# Patient Record
Sex: Female | Born: 2000 | Race: Black or African American | Hispanic: No | Marital: Single | Smoking: Current every day smoker
Health system: Southern US, Community
[De-identification: ages and names within clinical notes are randomized; demographics above are authoritative.]

## PROBLEM LIST (undated history)

## (undated) DIAGNOSIS — R45851 Suicidal ideations: Secondary | ICD-10-CM

## (undated) DIAGNOSIS — F322 Major depressive disorder, single episode, severe without psychotic features: Secondary | ICD-10-CM

## (undated) HISTORY — PX: NO PAST SURGERIES: SHX2092

---
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
## 2019-04-04 ENCOUNTER — Other Ambulatory Visit: Payer: Self-pay

## 2019-04-04 ENCOUNTER — Emergency Department (HOSPITAL_BASED_OUTPATIENT_CLINIC_OR_DEPARTMENT_OTHER)
Admission: EM | Admit: 2019-04-04 | Discharge: 2019-04-04 | Discharge status: Against Medical Advice | Payer: Medicaid Other | Attending: Emergency Medicine | Admitting: Emergency Medicine

## 2019-04-04 ENCOUNTER — Encounter (HOSPITAL_BASED_OUTPATIENT_CLINIC_OR_DEPARTMENT_OTHER): Payer: Self-pay | Admitting: Emergency Medicine

## 2019-04-04 DIAGNOSIS — Z87891 Personal history of nicotine dependence: Secondary | ICD-10-CM | POA: Insufficient documentation

## 2019-04-04 DIAGNOSIS — R112 Nausea with vomiting, unspecified: Secondary | ICD-10-CM | POA: Diagnosis not present

## 2019-04-04 DIAGNOSIS — R82998 Other abnormal findings in urine: Secondary | ICD-10-CM | POA: Diagnosis not present

## 2019-04-04 LAB — URINALYSIS, ROUTINE W REFLEX MICROSCOPIC
Glucose, UA: NEGATIVE mg/dL
Hgb urine dipstick: NEGATIVE
Ketones, ur: 40 mg/dL — AB
Nitrite: NEGATIVE
Protein, ur: NEGATIVE mg/dL
Specific Gravity, Urine: >1.03 — ABNORMAL HIGH (ref 1.005–1.030)
pH: 6 (ref 5.0–8.0)

## 2019-04-04 LAB — URINALYSIS, MICROSCOPIC (REFLEX)

## 2019-04-04 LAB — PREGNANCY, URINE: Preg Test, Ur: NEGATIVE

## 2019-04-04 NOTE — ED Provider Notes (Signed)
[facility] EMERGENCY DEPARTMENT Provider Note   CSN: [id number] Arrival date & time: 04/04/19  1604    History   Chief Complaint Chief Complaint  Patient presents with  . Emesis    HPI Jennifer Doyle is a 18 y.o. female past medical history of depression who presents for evaluation of intermittent nausea/vomiting x 1 week.  She reports that when she gets up in the morning, she will have one episode of vomiting and states that the emesis is nonbloody, nonbilious.  Patient reports that throughout the day, she was able to eat.  She does report she feels some nausea with eating but has not had any other episodes of vomiting.  Patient states she has not had any abdominal pain.  Patient states she came in today because she was concerned she might be pregnant.  She has not taken any pregnancy test at home.  Patient has not had any fevers, chest pain, difficulty breathing, dysuria, hematuria, vaginal bleeding, vaginal discharge.     The history is provided by the patient.    Past Medical History:  Diagnosis Date  . MDD (major depressive disorder), single episode, severe , no psychosis (HCC) 07/27/2017  . Suicidal ideation 07/27/2017    Patient Active Problem List   Diagnosis Date Noted  . MDD (major depressive disorder), single episode, severe , no psychosis (HCC) 07/27/2017  . Suicidal ideation 07/27/2017  . 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 NO PAST SURGERIES       OB History    Gravida  1   Para  1   Term  0   Preterm  1   AB      Living  1     SAB      TAB      Ectopic      Multiple      Live Births               Home Medications    Prior to Admission medications   Not on File    Family History No family history on file.  Social History Social History   Tobacco Use  . Smoking status: Former Smoker    Types: Cigarettes  .  Smokeless tobacco: Never Used  . Tobacco comment: 1 "Black" a day  Substance Use Topics  . Alcohol use: No  . Drug use: No     Allergies   Peanut-containing drug products   Review of Systems Review of Systems  Constitutional: Negative for fever.  Respiratory: Negative for cough and shortness of breath.   Cardiovascular: Negative for chest pain.  Gastrointestinal: Positive for nausea and vomiting. Negative for abdominal pain.  Genitourinary: Negative for dysuria, hematuria, vaginal bleeding and vaginal discharge.  Neurological: Negative for headaches.  All other systems reviewed and are negative.    Physical Exam Updated Vital Signs BP 127/75 (BP Location: Right Arm)   Pulse 87   Temp 99 F (37.2 C) (Oral)   Resp 14   Ht  (1.6 m)   Wt 66.2 kg   LMP 03/17/2019   SpO2 100%   BMI 25.86 kg/m   Physical Exam Vitals signs and nursing note reviewed.  Constitutional:      Appearance: Normal appearance. She is well-developed.     Comments: Sitting comfortably on examination table  HENT:     Head: Normocephalic and atraumatic.  Eyes:  General: Lids are normal.     Conjunctiva/sclera: Conjunctivae normal.     Pupils: Pupils are equal, round, and reactive to light.  Neck:     Musculoskeletal: Full passive range of motion without pain.  Cardiovascular:     Rate and Rhythm: Normal rate and regular rhythm.     Pulses: Normal pulses.     Heart sounds: Normal heart sounds. No murmur. No friction rub. No gallop.   Pulmonary:     Effort: Pulmonary effort is normal.     Breath sounds: Normal breath sounds.     Comments: Lungs clear to auscultation bilaterally.  Symmetric chest rise.  No wheezing, rales, rhonchi. Abdominal:     Palpations: Abdomen is soft. Abdomen is not rigid.     Tenderness: There is no abdominal tenderness. There is no right CVA tenderness, left CVA tenderness or guarding.     Comments: Abdomen is soft, non-distended, non-tender. No rigidity, No  guarding. No peritoneal signs.  Musculoskeletal: Normal range of motion.  Skin:    General: Skin is warm and dry.     Capillary Refill: Capillary refill takes less than 2 seconds.  Neurological:     Mental Status: She is alert and oriented to person, place, and time.  Psychiatric:        Speech: Speech normal.      ED Treatments / Results  Labs (all labs ordered are listed, but only abnormal results are displayed) Labs Reviewed  URINALYSIS, ROUTINE W REFLEX MICROSCOPIC - Abnormal; Notable for the following components:      Result Value   Specific Gravity, Urine >1.030 (*)    Bilirubin Urine SMALL (*)    Ketones, ur 40 (*)    Leukocytes,Ua MODERATE (*)    All other components within normal limits  URINALYSIS, MICROSCOPIC (REFLEX) - Abnormal; Notable for the following components:   Bacteria, UA FEW (*)    All other components within normal limits  URINE CULTURE  PREGNANCY, URINE    EKG None  Radiology No results found.  Procedures Procedures (including critical care time)  Medications Ordered in ED Medications - No data to display   Initial Impression / Assessment and Plan / ED Course  I have reviewed the triage vital signs and the nursing notes.  Pertinent labs & imaging results that were available during my care of the patient were reviewed by me and considered in my medical decision making (see chart for details).        18 y.o. F who presents for evaluation of intermittent nausea/vomiting this been ongoing x1 week.  No abdominal pain.  Patient reports he has not any vomiting today.  She has been able to eat today.  No fevers, vaginal, urinary complaints. Patient is afebrile, non-toxic appearing, sitting comfortably on examination table. Vital signs reviewed and stable.  Benign abdominal exam.  Urine ordered at triage.  Urine pregnancy negative.  UA shows moderate leukocytes, pyuria, bacteria.  She does have significant swelling epithelium so question  contaminant.  We will plan for urine culture.  We will plan to p.o. challenge and reevaluate.  RN inform me that patient had eloped prior to completion of treatment.  Portions of this note were generated with Scientist, clinical (histocompatibility and immunogenetics)Dragon dictation software. Dictation errors may occur despite best attempts at proofreading.   Final Clinical Impressions(s) / ED Diagnoses   Final diagnoses:  Nausea and vomiting, intractability of vomiting not specified, unspecified vomiting type    ED Discharge Orders    None  Maxwell Caul, PA-C 04/04/19 2228    Lawrance, Ambrose Finland, MD 04/04/19 2342

## 2019-04-04 NOTE — ED Notes (Signed)
Water given for fluid challenge  ?

## 2019-04-04 NOTE — ED Notes (Signed)
PT not in ED department,or waiting area. After looking 3 times. Provider aware.

## 2019-04-04 NOTE — ED Triage Notes (Signed)
Vomiting in the morning for a week.  Wants to know if she is pregnant.  Has not done any home pregnancy tests.

## 2019-04-04 NOTE — ED Notes (Signed)
Pt not in room or BR. Or waiting area.

## 2019-04-06 LAB — URINE CULTURE: Culture: >=100000 — AB

## 2019-04-22 ENCOUNTER — Other Ambulatory Visit: Payer: Self-pay

## 2019-04-22 ENCOUNTER — Emergency Department (HOSPITAL_BASED_OUTPATIENT_CLINIC_OR_DEPARTMENT_OTHER)
Admission: EM | Admit: 2019-04-22 | Discharge: 2019-04-22 | Disposition: A | Discharge status: Home / Self Care | Payer: Medicaid Other | Attending: Emergency Medicine | Admitting: Emergency Medicine

## 2019-04-22 ENCOUNTER — Encounter (HOSPITAL_BASED_OUTPATIENT_CLINIC_OR_DEPARTMENT_OTHER): Payer: Self-pay | Admitting: Emergency Medicine

## 2019-04-22 DIAGNOSIS — Z87891 Personal history of nicotine dependence: Secondary | ICD-10-CM | POA: Diagnosis not present

## 2019-04-22 DIAGNOSIS — R42 Dizziness and giddiness: Secondary | ICD-10-CM | POA: Diagnosis not present

## 2019-04-22 DIAGNOSIS — Z3202 Encounter for pregnancy test, result negative: Secondary | ICD-10-CM | POA: Diagnosis not present

## 2019-04-22 DIAGNOSIS — R112 Nausea with vomiting, unspecified: Secondary | ICD-10-CM | POA: Insufficient documentation

## 2019-04-22 LAB — URINALYSIS, ROUTINE W REFLEX MICROSCOPIC
Glucose, UA: NEGATIVE mg/dL
Hgb urine dipstick: NEGATIVE
Ketones, ur: NEGATIVE mg/dL
Nitrite: NEGATIVE
Protein, ur: 30 mg/dL — AB
Specific Gravity, Urine: 1.02 (ref 1.005–1.030)
pH: 7 (ref 5.0–8.0)

## 2019-04-22 LAB — URINALYSIS, MICROSCOPIC (REFLEX)

## 2019-04-22 LAB — PREGNANCY, URINE: Preg Test, Ur: NEGATIVE

## 2019-04-22 NOTE — ED Notes (Signed)
Patient is in a conference call while I went to see her.  She asked if I can come back.  Request granted.

## 2019-04-22 NOTE — ED Triage Notes (Signed)
N/V yesterday. None today. States dizziness.

## 2019-04-22 NOTE — Discharge Instructions (Signed)
You were seen in the ED for lightheadedness with walking.  Pregnancy test is negative.  We discussed that symptoms are likely related to dehydration since you do not drink a lot of water.  You should be drinking 1.5 to 2 L of water every day, more if you are sweating or exercising or doing heavy work.  You declined blood work and IV fluids today.  Make sure you stay hydrated at home.  Return to the ED for persistent or worsening lightheadedness, passing out, vomiting, diarrhea, abdominal pain

## 2019-04-22 NOTE — ED Provider Notes (Signed)
[facility] EMERGENCY DEPARTMENT Provider Note   CSN: [id number] Arrival date & time: 04/22/19  1509    History   Chief Complaint Chief Complaint  Patient presents with  . Nausea    HPI Jennifer Doyle is a 18 y.o. female is here for evaluation of lightheadedness.  Described as feeling faint.  Onset yesterday.  Lightheadedness comes on when she tries to get up and stand.  Gets better if she waits a few seconds or sits down.  Reports being nauseous and vomiting once yesterday but none today.  States she could not go to work because when she got there she could not get out of the car because she was too lightheaded.  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  She admits she usually does not drink a lot of water, she drinks 1 cup of water today.  Usually only drinks 1 bottle of water a day.  Since arriving to the ED she feels a lot better and feels like the air conditioning is making her feel better.  No associated CP, SOB, palpitations, syncope.  Denies fevers, chills, cough, congestion, rhinorrhea, sore throat, abdominal pain, diarrhea, constipation, dysuria, hematuria.  No more nausea or vomiting since yesterday.  No sick contacts.    HPI  Past Medical History:  Diagnosis Date  . MDD (major depressive disorder), single episode, severe , no psychosis (HCC) 07/27/2017  . Suicidal ideation 07/27/2017    Patient Active Problem List   Diagnosis Date Noted  . MDD (major depressive disorder), single episode, severe , no psychosis (HCC) 07/27/2017  . Suicidal ideation 07/27/2017  . 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 NO PAST SURGERIES       OB History    Gravida  1   Para  1   Term  0   Preterm  1   AB      Living  1     SAB      TAB      Ectopic      Multiple      Live Births               Home  Medications    Prior to Admission medications   Not on File    Family History No family history on file.  Social History Social History   Tobacco Use  . Smoking status: Former Smoker    Types: Cigarettes  . Smokeless tobacco: Never Used  . Tobacco comment: 1 "Black" a day  Substance Use Topics  . Alcohol use: No  . Drug use: No     Allergies   Peanut-containing drug products   Review of Systems Review of Systems  Neurological: Positive for light-headedness.  All other systems reviewed and are negative.    Physical Exam Updated Vital Signs BP 113/67 (BP Location: Left Arm)   Pulse 79   Temp 98 F (36.7 C) (Oral)   Resp 16   Ht 5\' 3"  (1.6 m)   Wt 68 kg   SpO2 100%   BMI 26.57 kg/m   Physical Exam Vitals signs and nursing note reviewed.  Constitutional:      Appearance: She is well-developed.     Comments: Non toxic in NAD  HENT:     Head: Normocephalic and atraumatic.     Nose: Nose normal.  Mouth/Throat:     Comments: MMM Eyes:     Conjunctiva/sclera: Conjunctivae normal.  Neck:     Musculoskeletal: Normal range of motion.  Cardiovascular:     Rate and Rhythm: Normal rate and regular rhythm.  Pulmonary:     Effort: Pulmonary effort is normal.     Breath sounds: Normal breath sounds.  Abdominal:     General: Bowel sounds are normal.     Palpations: Abdomen is soft.     Tenderness: There is no abdominal tenderness.     Comments: No G/R/R. No suprapubic or CVA tenderness. Negative Murphy's and McBurney's. Active BS to lower quadrants.   Musculoskeletal: Normal range of motion.  Skin:    General: Skin is warm and dry.     Capillary Refill: Capillary refill takes less than 2 seconds.  Neurological:     Mental Status: She is alert.  Psychiatric:        Behavior: Behavior normal.      ED Treatments / Results  Labs (all labs ordered are listed, but only abnormal results are displayed) Labs Reviewed  URINALYSIS, ROUTINE W REFLEX  MICROSCOPIC - Abnormal; Notable for the following components:      Result Value   APPearance HAZY (*)    Bilirubin Urine SMALL (*)    Protein, ur 30 (*)    Leukocytes,Ua TRACE (*)    All other components within normal limits  URINALYSIS, MICROSCOPIC (REFLEX) - Abnormal; Notable for the following components:   Bacteria, UA RARE (*)    All other components within normal limits  PREGNANCY, URINE    EKG None  Radiology No results found.  Procedures Procedures (including critical care time)  Medications Ordered in ED Medications - No data to display   Initial Impression / Assessment and Plan / ED Course  I have reviewed the triage vital signs and the nursing notes.  Pertinent labs & imaging results that were available during my care of the patient were reviewed by me and considered in my medical decision making (see chart for details).       ddx includes orthostasis vs vasovagal light-headedness vs dehydration.  Urine pregnancy negative.  She has no red flags associated with light-headedness such as CP, palpitations, SOB, syncope.  UA obtained at triage shows 30 protein but no ketones.  I offered patient screening labs to evaluate for electrolyte abnormalities, anemia, IVF, EKG, reassessment but she declined and states she thinks she is just dehydrated. She requested discharge.  This is reasonable, given her age, and otherwise benign ROS. Discussed plan to dc with oral hydration, rest. Return precautions given, she verbalized understanding and is in agreement with this.   Final Clinical Impressions(s) / ED Diagnoses   Final diagnoses:  Light headedness    ED Discharge Orders    None       Liberty HandyGibbons, Vaness Jelinski J, PA-C 04/22/19 Morton [id number]    Nanavati, Ankit, MD 04/22/19 1744

## 2019-05-18 ENCOUNTER — Emergency Department (HOSPITAL_BASED_OUTPATIENT_CLINIC_OR_DEPARTMENT_OTHER)
Admission: EM | Admit: 2019-05-18 | Discharge: 2019-05-18 | Disposition: A | Discharge status: Home / Self Care | Payer: Medicaid Other | Source: Home / Self Care | Attending: Emergency Medicine | Admitting: Emergency Medicine

## 2019-05-18 ENCOUNTER — Emergency Department (HOSPITAL_BASED_OUTPATIENT_CLINIC_OR_DEPARTMENT_OTHER): Payer: Medicaid Other

## 2019-05-18 ENCOUNTER — Encounter (HOSPITAL_BASED_OUTPATIENT_CLINIC_OR_DEPARTMENT_OTHER): Payer: Self-pay | Admitting: *Deleted

## 2019-05-18 ENCOUNTER — Other Ambulatory Visit: Payer: Self-pay

## 2019-05-18 DIAGNOSIS — F17210 Nicotine dependence, cigarettes, uncomplicated: Secondary | ICD-10-CM | POA: Insufficient documentation

## 2019-05-18 DIAGNOSIS — Z91010 Allergy to peanuts: Secondary | ICD-10-CM | POA: Insufficient documentation

## 2019-05-18 DIAGNOSIS — N7093 Salpingitis and oophoritis, unspecified: Secondary | ICD-10-CM

## 2019-05-18 DIAGNOSIS — Z20828 Contact with and (suspected) exposure to other viral communicable diseases: Secondary | ICD-10-CM | POA: Insufficient documentation

## 2019-05-18 LAB — COMPREHENSIVE METABOLIC PANEL
ALT: 8 U/L (ref 0–44)
AST: 11 U/L — ABNORMAL LOW (ref 15–41)
Albumin: 3.7 g/dL (ref 3.5–5.0)
Alkaline Phosphatase: 57 U/L (ref 38–126)
Anion gap: 12 (ref 5–15)
BUN: 12 mg/dL (ref 6–20)
CO2: 20 mmol/L — ABNORMAL LOW (ref 22–32)
Calcium: 9 mg/dL (ref 8.9–10.3)
Chloride: 104 mmol/L (ref 98–111)
Creatinine, Ser: 0.7 mg/dL (ref 0.44–1.00)
GFR calc Af Amer: >60 mL/min (ref >60)
GFR calc non Af Amer: >60 mL/min (ref >60)
Glucose, Bld: 82 mg/dL (ref 70–99)
Potassium: 3.1 mmol/L — ABNORMAL LOW (ref 3.5–5.1)
Sodium: 136 mmol/L (ref 135–145)
Total Bilirubin: 0.5 mg/dL (ref 0.3–1.2)
Total Protein: 8.2 g/dL — ABNORMAL HIGH (ref 6.5–8.1)

## 2019-05-18 LAB — URINALYSIS, ROUTINE W REFLEX MICROSCOPIC
Glucose, UA: NEGATIVE mg/dL
Hgb urine dipstick: NEGATIVE
Ketones, ur: NEGATIVE mg/dL
Leukocytes,Ua: NEGATIVE
Nitrite: POSITIVE — AB
Protein, ur: 100 mg/dL — AB
Specific Gravity, Urine: >1.03 — ABNORMAL HIGH (ref 1.005–1.030)
pH: 6 (ref 5.0–8.0)

## 2019-05-18 LAB — URINALYSIS, MICROSCOPIC (REFLEX): RBC / HPF: NONE SEEN RBC/hpf (ref 0–5)

## 2019-05-18 LAB — CBC WITH DIFFERENTIAL/PLATELET
Abs Immature Granulocytes: 0.08 10*3/uL — ABNORMAL HIGH (ref 0.00–0.07)
Basophils Absolute: 0.1 10*3/uL (ref 0.0–0.1)
Basophils Relative: 0 %
Eosinophils Absolute: 0 10*3/uL (ref 0.0–0.5)
Eosinophils Relative: 0 %
HCT: 30.4 % — ABNORMAL LOW (ref 36.0–46.0)
Hemoglobin: 9.8 g/dL — ABNORMAL LOW (ref 12.0–15.0)
Immature Granulocytes: 0 %
Lymphocytes Relative: 8 %
Lymphs Abs: 1.5 10*3/uL (ref 0.7–4.0)
MCH: 28.3 pg (ref 26.0–34.0)
MCHC: 32.2 g/dL (ref 30.0–36.0)
MCV: 87.9 fL (ref 80.0–100.0)
Monocytes Absolute: 0.7 10*3/uL (ref 0.1–1.0)
Monocytes Relative: 3 %
Neutro Abs: 17.4 10*3/uL — ABNORMAL HIGH (ref 1.7–7.7)
Neutrophils Relative %: 89 %
Platelets: 336 10*3/uL (ref 150–400)
RBC: 3.46 MIL/uL — ABNORMAL LOW (ref 3.87–5.11)
RDW: 18.7 % — ABNORMAL HIGH (ref 11.5–15.5)
WBC: 19.7 10*3/uL — ABNORMAL HIGH (ref 4.0–10.5)
nRBC: 0 % (ref 0.0–0.2)

## 2019-05-18 LAB — WET PREP, GENITAL
Sperm: NONE SEEN
Yeast Wet Prep HPF POC: NONE SEEN

## 2019-05-18 LAB — PREGNANCY, URINE: Preg Test, Ur: NEGATIVE

## 2019-05-18 LAB — SARS CORONAVIRUS 2 AG (30 MIN TAT): SARS Coronavirus 2 Ag: NEGATIVE

## 2019-05-18 MED ORDER — IOHEXOL 300 MG/ML  SOLN
100.0000 mL | Freq: Once | INTRAMUSCULAR | Status: AC | PRN
  Administered 2019-05-18: 100 mL via INTRAVENOUS

## 2019-05-18 MED ORDER — MORPHINE SULFATE (PF) 4 MG/ML IV SOLN
4.0000 mg | Freq: Once | INTRAVENOUS | Status: AC
  Administered 2019-05-18: 4 mg via INTRAVENOUS
  Filled 2019-05-18: qty 1

## 2019-05-18 MED ORDER — METRONIDAZOLE IN NACL 5-0.79 MG/ML-% IV SOLN
500.0000 mg | Freq: Once | INTRAVENOUS | Status: AC
  Administered 2019-05-18: 500 mg via INTRAVENOUS
  Filled 2019-05-18: qty 100

## 2019-05-18 MED ORDER — MORPHINE SULFATE (PF) 2 MG/ML IV SOLN
2.0000 mg | Freq: Once | INTRAVENOUS | Status: AC
  Administered 2019-05-18: 2 mg via INTRAVENOUS
  Filled 2019-05-18: qty 1

## 2019-05-18 MED ORDER — SODIUM CHLORIDE 0.9 % IV SOLN
100.0000 mg | Freq: Once | INTRAVENOUS | Status: AC
  Administered 2019-05-18: 100 mg via INTRAVENOUS
  Filled 2019-05-18: qty 100

## 2019-05-18 MED ORDER — DOXYCYCLINE HYCLATE 100 MG IV SOLR
INTRAVENOUS | Status: AC
  Filled 2019-05-18: qty 100

## 2019-05-18 MED ORDER — ACETAMINOPHEN 500 MG PO TABS
1000.0000 mg | ORAL_TABLET | Freq: Once | ORAL | Status: AC
  Administered 2019-05-18: 1000 mg via ORAL
  Filled 2019-05-18: qty 2

## 2019-05-18 MED ORDER — SODIUM CHLORIDE 0.9 % IV BOLUS
1000.0000 mL | Freq: Once | INTRAVENOUS | Status: AC
  Administered 2019-05-18: 1000 mL via INTRAVENOUS

## 2019-05-18 MED ORDER — ONDANSETRON HCL 4 MG/2ML IJ SOLN
4.0000 mg | Freq: Once | INTRAMUSCULAR | Status: AC
  Administered 2019-05-18: 4 mg via INTRAVENOUS
  Filled 2019-05-18: qty 2

## 2019-05-18 NOTE — ED Notes (Signed)
EDP at bedside assessing patient during triage

## 2019-05-18 NOTE — ED Notes (Signed)
Patient and family member updated on care plan.

## 2019-05-18 NOTE — ED Provider Notes (Signed)
[facility] EMERGENCY DEPARTMENT Provider Note   CSN: [id number] Arrival date & time: 05/18/19  1548     History   Chief Complaint Chief Complaint  Patient presents with   Abdominal Pain    HPI Jennifer Doyle is a 18 y.o. female.     The history is provided by the patient.  Abdominal Pain Pain location:  LLQ and RLQ Pain quality: aching, cramping, sharp and shooting   Pain radiates to:  Does not radiate Pain severity:  Severe Onset quality:  Gradual Duration:  18 hours Timing:  Constant Progression:  Worsening Chronicity:  New Context: not diet changes, not medication withdrawal, not previous surgeries, not recent illness, not sick contacts and not suspicious food intake   Context comment:  Patient states that yesterday sometime after dinner and before she went to bed her lower abdomen started hurting and worsened throughout the night to where she was unable to sleep Relieved by: Better with lying still. Worsened by:  Coughing, movement, deep breathing and position changes Ineffective treatments:  NSAIDs Associated symptoms: anorexia, nausea and vomiting   Associated symptoms: no constipation, no cough, no diarrhea, no dysuria, no fever, no hematuria, no shortness of breath, no vaginal bleeding and no vaginal discharge   Associated symptoms comment:  Patient states that her stomach hurts when she is urinating but she is having no dysuria, frequency or urgency.  She denies any vaginal discharge or bleeding.  LMP was 2 weeks ago and patient has not been sexually active for several months. 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  Social History Social History   Tobacco Use   Smoking status: Current Every Day Smoker    Types: Cigarettes, Cigars   Smokeless tobacco: Never Used   Tobacco comment: 1 "Black" a day  Substance Use Topics   Alcohol use: Not Currently   Drug use: No     Allergies   Peanut-containing drug products   Review of Systems Review of Systems  Constitutional: Negative for fever.  Respiratory: Negative for cough and shortness of breath.   Gastrointestinal: Positive for abdominal pain, anorexia, nausea and vomiting. Negative for constipation and diarrhea.  Genitourinary: Negative for dysuria, hematuria, vaginal bleeding and vaginal  discharge.  All other systems reviewed and are negative.    Physical Exam Updated Vital Signs BP 127/68 (BP Location: Right Arm)    Pulse (!) 101    Temp 99.7 F (37.6 C) (Oral)    Resp (!) 22    Ht 5\' 3"  (1.6 m)    Wt 68 kg    LMP 04/28/2019 (Approximate)    SpO2 100%    BMI 26.56 kg/m   Physical Exam Vitals signs and nursing note reviewed.  Constitutional:      General: She is not in acute distress.    Appearance: She is well-developed and normal weight.  HENT:     Head: Normocephalic and atraumatic.     Mouth/Throat:     Comments: Dry mucous membranes Eyes:     Pupils: Pupils are equal, round, and reactive to light.   Cardiovascular:     Rate and Rhythm: Regular rhythm. Tachycardia present.     Heart sounds: Normal heart sounds. No murmur. No friction rub.  Pulmonary:     Effort: Pulmonary effort is normal.     Breath sounds: Normal breath sounds. No wheezing or rales.  Abdominal:     General: Bowel sounds are normal. There is no distension.     Palpations: Abdomen is soft.     Tenderness: There is abdominal tenderness in the right lower quadrant, suprapubic area and left lower quadrant. There is guarding. There is no right CVA tenderness, left CVA tenderness or rebound.     Hernia: No hernia is present.  Genitourinary:    Vagina: Vaginal discharge present.     Cervix: Cervical motion tenderness and discharge present.     Uterus: Normal.      Adnexa:        Right: Tenderness present.        Left: Tenderness present.   Musculoskeletal: Normal range of motion.        General: No tenderness.     Comments: No edema  Skin:    General: Skin is warm and dry.     Findings: No rash.  Neurological:     General: No focal deficit present.     Mental Status: She is alert and oriented to person, place, and time.     Cranial Nerves: No cranial nerve deficit.  Psychiatric:        Mood and Affect: Mood normal.        Behavior: Behavior normal.      ED Treatments / Results  Labs (all labs ordered are listed, but only abnormal results are displayed) Labs Reviewed  CBC WITH DIFFERENTIAL/PLATELET - Abnormal; Notable for the following components:      Result Value   WBC 19.7 (*)    RBC 3.46 (*)    Hemoglobin 9.8 (*)    HCT 30.4 (*)    RDW 18.7 (*)    Neutro Abs 17.4 (*)    Abs Immature Granulocytes 0.08 (*)    All other components within normal limits  COMPREHENSIVE METABOLIC PANEL - Abnormal; Notable for the following components:   Potassium 3.1 (*)    CO2 20 (*)    Total Protein 8.2 (*)    AST 11 (*)    All other components within normal limits  URINALYSIS, ROUTINE W REFLEX MICROSCOPIC -  Abnormal; Notable for the following components:   Color, Urine AMBER (*)    APPearance TURBID (*)    Specific Gravity, Urine >1.030 (*)    Bilirubin Urine SMALL (*)  Protein, ur 100 (*)    Nitrite POSITIVE (*)    All other components within normal limits  URINALYSIS, MICROSCOPIC (REFLEX) - Abnormal; Notable for the following components:   Bacteria, UA FEW (*)    All other components within normal limits  WET PREP, GENITAL  PREGNANCY, URINE  GC/CHLAMYDIA PROBE AMP ([place]) NOT AT [facility]    EKG    Radiology US Transvaginal Non-ob  Result Date: 05/18/2019 CLINICAL DATA:  Pelvic pain EXAM: TRANSABDOMINAL AND TRANSVAGINAL ULTRASOUND OF PELVIS DOPPLER ULTRASOUND OF OVARIES TECHNIQUE: Both transabdominal and transvaginal ultrasound examinations of the pelvis were performed. Transabdominal technique was performed for global imaging of the pelvis including uterus, ovaries, adnexal regions, and pelvic cul-de-sac. It was necessary to proceed with endovaginal exam following the transabdominal exam to visualize the ovaries. Color and duplex Doppler ultrasound was utilized to evaluate blood flow to the ovaries. COMPARISON:  None. FINDINGS: Uterus Measurements: 7.4 x 3.5 x 3.8 cm = volume: 52 mL. No fibroids or other mass visualized. Endometrium Thickness: 5 mm.  No focal abnormality visualized. Right ovary Measurements: 5.2 x 4.4 by 3 cm = volume: 35 mL. There is a serpiginous fluid-filled structure in the right adnexal region. The ovaries are somewhat indistinct. Left ovary Measurements: 6.2 x 4.4 by 4.5 cm = volume: 63 mL. There is a serpiginous fluid-filled structure in the left adnexal region. The ovaries are somewhat indistinct. Pulsed Doppler evaluation of both ovaries demonstrates normal low-resistance arterial and venous waveforms. Other findings There is a small amount of free fluid in the pelvis. IMPRESSION: 1. Overall findings suspicious for bilateral tubo-ovarian abscesses in the appropriate  clinical setting. 2. No definite sonographic evidence for ovarian torsion. Electronically Signed   By: Constance Holster M.D.   On: 05/18/2019 19:10   US Pelvis Complete  Result Date: 05/18/2019 CLINICAL DATA:  Pelvic pain EXAM: TRANSABDOMINAL AND TRANSVAGINAL ULTRASOUND OF PELVIS DOPPLER ULTRASOUND OF OVARIES TECHNIQUE: Both transabdominal and transvaginal ultrasound examinations of the pelvis were performed. Transabdominal technique was performed for global imaging of the pelvis including uterus, ovaries, adnexal regions, and pelvic cul-de-sac. It was necessary to proceed with endovaginal exam following the transabdominal exam to visualize the ovaries. Color and duplex Doppler ultrasound was utilized to evaluate blood flow to the ovaries. COMPARISON:  None. FINDINGS: Uterus Measurements: 7.4 x 3.5 x 3.8 cm = volume: 52 mL. No fibroids or other mass visualized. Endometrium Thickness: 5 mm.  No focal abnormality visualized. Right ovary Measurements: 5.2 x 4.4 by 3 cm = volume: 35 mL. There is a serpiginous fluid-filled structure in the right adnexal region. The ovaries are somewhat indistinct. Left ovary Measurements: 6.2 x 4.4 by 4.5 cm = volume: 63 mL. There is a serpiginous fluid-filled structure in the left adnexal region. The ovaries are somewhat indistinct. Pulsed Doppler evaluation of both ovaries demonstrates normal low-resistance arterial and venous waveforms. Other findings There is a small amount of free fluid in the pelvis. IMPRESSION: 1. Overall findings suspicious for bilateral tubo-ovarian abscesses in the appropriate clinical setting. 2. No definite sonographic evidence for ovarian torsion. Electronically Signed   By: Constance Holster M.D.   On: 05/18/2019 19:10   Ct Abdomen Pelvis W Contrast  Result Date: 05/18/2019 CLINICAL DATA:  Lower abdominal pain, appendicitis suspected EXAM: CT ABDOMEN AND PELVIS WITH CONTRAST TECHNIQUE: Multidetector CT imaging of the abdomen and pelvis was  performed using the standard protocol following bolus administration of intravenous contrast. CONTRAST:  14mL OMNIPAQUE IOHEXOL 300 MG/ML  SOLN COMPARISON:  None.  FINDINGS: Lower chest: No acute abnormality. Hepatobiliary: No solid liver abnormality is seen. No gallstones, gallbladder wall thickening, or biliary dilatation. Pancreas: Unremarkable. No pancreatic ductal dilatation or surrounding inflammatory changes. Spleen: Normal in size without significant abnormality. Adrenals/Urinary Tract: Adrenal glands are unremarkable. Kidneys are normal, without renal calculi, solid lesion, or hydronephrosis. Bladder is unremarkable. Stomach/Bowel: Stomach is within normal limits. Appendix is not visualized in the right lower quadrant. No evidence of bowel wall thickening, distention, or inflammatory changes. Vascular/Lymphatic: No significant vascular findings are present. No enlarged abdominal or pelvic lymph nodes. Reproductive: There are fluid-filled structures in the the low pelvis, which are likely loops of bowel. The ovaries and adnexa are not clearly visualized (e.g. Series 2, image 70, series 5, image 49). Other: No abdominal wall hernia or abnormality. Small volume free fluid in the low pelvis. Musculoskeletal: No acute or significant osseous findings. IMPRESSION: 1. The appendix is not clearly visualized in the right lower quadrant. Acute appendicitis is neither included or excluded. Repeat CT with the administration of oral enteric contrast may be helpful for localization. 2. There are fluid-filled structures in the the low pelvis, which are likely loops of bowel. The ovaries and adnexa are not clearly visualized (e.g. Series 2, image 70, series 5, image 49). Hydrosalpinx and/or ovarian lesions are differential considerations. Dedicated pelvic ultrasound may be helpful to further evaluate depending upon clinical suspicion. 3. Small volume nonspecific free fluid in the low pelvis, which may be functional in the  reproductive age setting or reactive. Electronically Signed   By: Lauralyn PrimesAlex  Bibbey M.D.   On: 05/18/2019 17:47   [place] Art/ven Flow Abd Pelv Doppler  Result Date: 05/18/2019 CLINICAL DATA:  Pelvic pain EXAM: TRANSABDOMINAL AND TRANSVAGINAL ULTRASOUND OF PELVIS DOPPLER ULTRASOUND OF OVARIES TECHNIQUE: Both transabdominal and transvaginal ultrasound examinations of the pelvis were performed. Transabdominal technique was performed for global imaging of the pelvis including uterus, ovaries, adnexal regions, and pelvic cul-de-sac. It was necessary to proceed with endovaginal exam following the transabdominal exam to visualize the ovaries. Color and duplex Doppler ultrasound was utilized to evaluate blood flow to the ovaries. COMPARISON:  None. FINDINGS: Uterus Measurements: 7.4 x 3.5 x 3.8 cm = volume: 52 mL. No fibroids or other mass visualized. Endometrium Thickness: 5 mm.  No focal abnormality visualized. Right ovary Measurements: 5.2 x 4.4 by 3 cm = volume: 35 mL. There is a serpiginous fluid-filled structure in the right adnexal region. The ovaries are somewhat indistinct. Left ovary Measurements: 6.2 x 4.4 by 4.5 cm = volume: 63 mL. There is a serpiginous fluid-filled structure in the left adnexal region. The ovaries are somewhat indistinct. Pulsed Doppler evaluation of both ovaries demonstrates normal low-resistance arterial and venous waveforms. Other findings There is a small amount of free fluid in the pelvis. IMPRESSION: 1. Overall findings suspicious for bilateral tubo-ovarian abscesses in the appropriate clinical setting. 2. No definite sonographic evidence for ovarian torsion. Electronically Signed   By: Katherine Mantlehristopher  Green M.D.   On: 05/18/2019 19:10    Procedures Procedures (including critical care time)  Medications Ordered in ED Medications  doxycycline (VIBRAMYCIN) 100 mg in sodium chloride 0.9 % 250 mL IVPB (has no administration in time range)  metroNIDAZOLE (FLAGYL) IVPB 500 mg (has no  administration in time range)  sodium chloride 0.9 % bolus 1,000 mL (0 mLs Intravenous Stopped 05/18/19 1753)  ondansetron (ZOFRAN) injection 4 mg (4 mg Intravenous Given 05/18/19 1630)  morphine 4 MG/ML injection 4 mg (4 mg Intravenous Given 05/18/19 1630)  iohexol (OMNIPAQUE) 300 MG/ML solution 100 mL (100 mLs Intravenous Contrast Given 05/18/19 1719)  morphine 2 MG/ML injection 2 mg (2 mg Intravenous Given 05/18/19 1943)     Initial Impression / Assessment and Plan / ED Course  I have reviewed the triage vital signs and the nursing notes.  Pertinent labs & imaging results that were available during my care of the patient were reviewed by me and considered in my medical decision making (see chart for details).       Patient is an 18 year old G1, P1 otherwise healthy female presenting with 18 hours of worsening lower abdominal pain.  Patient does have some guarding and seems to be worse in the right lower quadrant.  Suspicion for appendicitis versus UTI versus kidney stone versus ovarian torsion vs TOA or STI but patient has not been sexually active in several months and has not had any vaginal discharge.  Patient currently states her pain is a 10 out of 10 and worse with movement.  Labs, fluids, pain control and CT of abdomen pelvis pending  5:21 PM Patient has a leukocytosis of 19,000, CMP without acute findings except for potassium of 3.1, negative urine pregnancy and UA without strong suspicion of infection.  There is positive nitrites but negative leukocytes and 0-5 white blood cells.  CT pending for further results.  6:17 PM Patient CT is equivocal and appendicitis cannot be excluded.  Also there is a fluid-filled structure in the lower pelvis which is likely loops of bowel but they could not visualize the ovaries or adnexa very well and recommended a dedicated pelvic ultrasound.  7:34 PM Ultrasound shows concern for bilateral tubo-ovarian abscesses on exam patient has cervical motion  tenderness, foul-smelling greenish-yellow discharge and copious amounts.  Patient now afebrile.  Will discuss with OB/GYN.  8:13 PM OB/GYN recommends admission for IV antibiotics.  However after speaking with the patient she has a Engineer, drillingprobation officer appointment tomorrow at 1030 and if she does not go they will put her in jail.  Patient is refusing to stay at this time but did get the first dose of IV doxycycline and Flagyl.  [facility] and they will expect her tomorrow after her appointment and will admit her for further IV antibiotics.  This was discussed with the patient and her mom who are currently agreeable to this plan.  Discussed with the patient that she could potentially get worse in the meantime and if so she should seek medical care immediately.  Final Clinical Impressions(s) / ED Diagnoses   Final diagnoses:  TOA (tubo-ovarian abscess)    ED Discharge Orders    None       Gwyneth SproutPlunkett, [place], MD 05/18/19 2015

## 2019-05-18 NOTE — ED Triage Notes (Addendum)
Pt reports mid lower abdominal pain since last night and her stomach hurts when she pees. Reports she vomited x 1 around 0400

## 2019-05-18 NOTE — ED Notes (Signed)
Patient's mother given an update per patient request at this time.

## 2019-05-19 ENCOUNTER — Inpatient Hospital Stay (HOSPITAL_COMMUNITY)
Admission: AD | Admit: 2019-05-19 | Discharge: 2019-05-21 | DRG: 759 | Disposition: A | Discharge status: Home / Self Care | Payer: Medicaid Other | Attending: Obstetrics and Gynecology | Admitting: Obstetrics and Gynecology

## 2019-05-19 ENCOUNTER — Emergency Department (HOSPITAL_COMMUNITY): Admission: EM | Admit: 2019-05-19 | Payer: Medicaid Other | Source: Home / Self Care

## 2019-05-19 DIAGNOSIS — A5901 Trichomonal vulvovaginitis: Secondary | ICD-10-CM | POA: Diagnosis present

## 2019-05-19 DIAGNOSIS — N7093 Salpingitis and oophoritis, unspecified: Secondary | ICD-10-CM | POA: Diagnosis present

## 2019-05-19 DIAGNOSIS — F329 Major depressive disorder, single episode, unspecified: Secondary | ICD-10-CM | POA: Diagnosis present

## 2019-05-19 DIAGNOSIS — F17210 Nicotine dependence, cigarettes, uncomplicated: Secondary | ICD-10-CM | POA: Diagnosis present

## 2019-05-19 DIAGNOSIS — Z1159 Encounter for screening for other viral diseases: Secondary | ICD-10-CM

## 2019-05-19 DIAGNOSIS — R112 Nausea with vomiting, unspecified: Secondary | ICD-10-CM | POA: Diagnosis present

## 2019-05-19 MED ORDER — METRONIDAZOLE 500 MG PO TABS: 2000.0000 mg | ORAL_TABLET | Freq: Once | ORAL | Status: DC

## 2019-05-19 MED ORDER — AZITHROMYCIN 250 MG PO TABS
1000.0000 mg | ORAL_TABLET | Freq: Once | ORAL | Status: AC
  Administered 2019-05-19: 1000 mg via ORAL
  Filled 2019-05-19: qty 4

## 2019-05-19 MED ORDER — SODIUM CHLORIDE 0.9 % IV SOLN
100.0000 mg | Freq: Two times a day (BID) | INTRAVENOUS | Status: DC
  Administered 2019-05-19 – 2019-05-20 (×2): 100 mg via INTRAVENOUS
  Filled 2019-05-19 (×3): qty 100

## 2019-05-19 MED ORDER — METRONIDAZOLE 500 MG PO TABS
2000.0000 mg | ORAL_TABLET | Freq: Once | ORAL | Status: AC
  Administered 2019-05-20: 2000 mg via ORAL
  Filled 2019-05-19: qty 4

## 2019-05-19 MED ORDER — IBUPROFEN 600 MG PO TABS: 600.0000 mg | ORAL_TABLET | Freq: Four times a day (QID) | ORAL | Status: DC | PRN

## 2019-05-19 MED ORDER — HYDROCODONE-ACETAMINOPHEN 5-325 MG PO TABS: 1.0000 | ORAL_TABLET | ORAL | Status: DC | PRN

## 2019-05-19 MED ORDER — LACTATED RINGERS IV SOLN
INTRAVENOUS | Status: DC
  Administered 2019-05-19: 21:00:00 via INTRAVENOUS

## 2019-05-19 MED ORDER — DOXYCYCLINE HYCLATE 100 MG PO TABS: 100.0000 mg | ORAL_TABLET | Freq: Two times a day (BID) | ORAL | Status: DC

## 2019-05-19 MED ORDER — AZITHROMYCIN 1 G PO PACK: 1.0000 g | PACK | Freq: Once | ORAL | Status: DC

## 2019-05-19 MED ORDER — CEFTRIAXONE SODIUM 500 MG IJ SOLR
250.0000 mg | Freq: Once | INTRAMUSCULAR | Status: AC
  Administered 2019-05-19: 250 mg via INTRAMUSCULAR
  Filled 2019-05-19: qty 500

## 2019-05-19 MED ORDER — ONDANSETRON HCL 4 MG/2ML IJ SOLN
4.0000 mg | Freq: Four times a day (QID) | INTRAMUSCULAR | Status: DC | PRN
  Administered 2019-05-19: 4 mg via INTRAVENOUS
  Filled 2019-05-19: qty 2

## 2019-05-19 MED ORDER — SODIUM CHLORIDE 0.9 % IV SOLN
2.0000 g | Freq: Four times a day (QID) | INTRAVENOUS | Status: DC
  Administered 2019-05-19 – 2019-05-20 (×3): 2 g via INTRAVENOUS
  Filled 2019-05-19 (×5): qty 2

## 2019-05-19 MED ORDER — DOCUSATE SODIUM 100 MG PO CAPS
100.0000 mg | ORAL_CAPSULE | Freq: Two times a day (BID) | ORAL | Status: DC
  Administered 2019-05-19 – 2019-05-21 (×4): 100 mg via ORAL
  Filled 2019-05-19 (×4): qty 1

## 2019-05-19 MED ORDER — ONDANSETRON HCL 4 MG PO TABS: 4.0000 mg | ORAL_TABLET | Freq: Four times a day (QID) | ORAL | Status: DC | PRN

## 2019-05-19 MED ORDER — PRENATAL MULTIVITAMIN CH
1.0000 | ORAL_TABLET | Freq: Every day | ORAL | Status: DC
  Administered 2019-05-20: 1 via ORAL
  Filled 2019-05-19 (×2): qty 1

## 2019-05-19 NOTE — Plan of Care (Signed)
  Problem: Education: Goal: Knowledge of General Education information will improve Description Including pain rating scale, medication(s)/side effects and non-pharmacologic comfort measures Outcome: Progressing   

## 2019-05-19 NOTE — H&P (Signed)
Jennifer Doyle is an 18 y.o. female P34 admitted for parenteral treatment of bilateral TOA. Patient presented to [facility] yesterday with complaints of worsening abdominal pain which she described as a Michiah Mudry sharp pain localized in her lower abdomen that is non-radiating. Today she reports her pain as much improved but reports emesis this morning and persistent anorexia. She denies any fever, or chills. Patient reports last sexual encounter was months ago. Patient is without any other complaints.   Pertinent Gynecological History: Menses: regular every month without intermenstrual spotting Contraception: abstinence DES exposure: denies Blood transfusions: none OB History: G1, P0101   Menstrual History: Patient's last menstrual period was 04/28/2019 (approximate).    Past Medical History:  Diagnosis Date  . MDD (major depressive disorder), single episode, severe , no psychosis ([place]) 07/27/2017  . Suicidal ideation 07/27/2017    Past Surgical History:  Procedure Laterality Date  . NO PAST SURGERIES      No family history on file.  Social History:  reports that she has been smoking cigarettes and cigars. She has never used smokeless tobacco. She reports previous alcohol use. She reports that she does not use drugs.  Allergies:  Allergies  Allergen Reactions  . Peanut-Containing Drug Products Swelling    No medications prior to admission.    ROS See pertinent in HPI Blood pressure 124/72, pulse 90, temperature 98.6 F (37 C), temperature source Oral, resp. rate 16, height 5\' 2"  (1.575 m), weight 61.5 kg, last menstrual period 04/28/2019, SpO2 100 %. Physical Exam GENERAL: Well-developed, well-nourished female in no acute distress.  LUNGS: Clear to auscultation bilaterally.  HEART: Regular rate and rhythm. ABDOMEN: Soft, non distended, non tender PELVIC: exam not repeated today but +CMT was noted on ED pelvic exam EXTREMITIES: No cyanosis, clubbing, or edema, 2+  distal pulses.  Results for orders placed or performed during the hospital encounter of 05/18/19 (from the past 24 hour(s))  Wet prep, genital     Status: Abnormal   Collection Time: 05/18/19  7:33 PM   Specimen: Cervix; Genital  Result Value Ref Range   Yeast Wet Prep HPF POC NONE SEEN NONE SEEN   Trich, Wet Prep PRESENT (A) NONE SEEN   Clue Cells Wet Prep HPF POC PRESENT (A) NONE SEEN   WBC, Wet Prep HPF POC MANY (A) NONE SEEN   Sperm NONE SEEN   SARS Coronavirus 2 (Hosp order,Performed in [place] lab via [facility])     Status: None   Collection Time: 05/18/19  8:19 PM   Specimen: Dry Nasal Swab ([facility] Now)  Result Value Ref Range   SARS Coronavirus 2 ([facility] Now) NEGATIVE NEGATIVE    US Transvaginal Non-ob  Result Date: 05/18/2019 CLINICAL DATA:  Pelvic pain EXAM: TRANSABDOMINAL AND TRANSVAGINAL ULTRASOUND OF PELVIS DOPPLER ULTRASOUND OF OVARIES TECHNIQUE: Both transabdominal and transvaginal ultrasound examinations of the pelvis were performed. Transabdominal technique was performed for global imaging of the pelvis including uterus, ovaries, adnexal regions, and pelvic cul-de-sac. It was necessary to proceed with endovaginal exam following the transabdominal exam to visualize the ovaries. Color and duplex Doppler ultrasound was utilized to evaluate blood flow to the ovaries. COMPARISON:  None. FINDINGS: Uterus Measurements: 7.4 x 3.5 x 3.8 cm = volume: 52 mL. No fibroids or other mass visualized. Endometrium Thickness: 5 mm.  No focal abnormality visualized. Right ovary Measurements: 5.2 x 4.4 by 3 cm = volume: 35 mL. There is a serpiginous fluid-filled structure in the right adnexal region. The ovaries  are somewhat indistinct. Left ovary Measurements: 6.2 x 4.4 by 4.5 cm = volume: 63 mL. There is a serpiginous fluid-filled structure in the left adnexal region. The ovaries are somewhat indistinct. Pulsed Doppler evaluation of both ovaries demonstrates normal low-resistance  arterial and venous waveforms. Other findings There is a small amount of free fluid in the pelvis. IMPRESSION: 1. Overall findings suspicious for bilateral tubo-ovarian abscesses in the appropriate clinical setting. 2. No definite sonographic evidence for ovarian torsion. Electronically Signed   By: Katherine Mantlehristopher  Green M.D.   On: 05/18/2019 19:10   [place] Pelvis Complete  Result Date: 05/18/2019 CLINICAL DATA:  Pelvic pain EXAM: TRANSABDOMINAL AND TRANSVAGINAL ULTRASOUND OF PELVIS DOPPLER ULTRASOUND OF OVARIES TECHNIQUE: Both transabdominal and transvaginal ultrasound examinations of the pelvis were performed. Transabdominal technique was performed for global imaging of the pelvis including uterus, ovaries, adnexal regions, and pelvic cul-de-sac. It was necessary to proceed with endovaginal exam following the transabdominal exam to visualize the ovaries. Color and duplex Doppler ultrasound was utilized to evaluate blood flow to the ovaries. COMPARISON:  None. FINDINGS: Uterus Measurements: 7.4 x 3.5 x 3.8 cm = volume: 52 mL. No fibroids or other mass visualized. Endometrium Thickness: 5 mm.  No focal abnormality visualized. Right ovary Measurements: 5.2 x 4.4 by 3 cm = volume: 35 mL. There is a serpiginous fluid-filled structure in the right adnexal region. The ovaries are somewhat indistinct. Left ovary Measurements: 6.2 x 4.4 by 4.5 cm = volume: 63 mL. There is a serpiginous fluid-filled structure in the left adnexal region. The ovaries are somewhat indistinct. Pulsed Doppler evaluation of both ovaries demonstrates normal low-resistance arterial and venous waveforms. Other findings There is a small amount of free fluid in the pelvis. IMPRESSION: 1. Overall findings suspicious for bilateral tubo-ovarian abscesses in the appropriate clinical setting. 2. No definite sonographic evidence for ovarian torsion. Electronically Signed   By: Katherine Mantlehristopher  Green M.D.   On: 05/18/2019 19:10   Ct Abdomen Pelvis W  Contrast  Result Date: 05/18/2019 CLINICAL DATA:  Lower abdominal pain, appendicitis suspected EXAM: CT ABDOMEN AND PELVIS WITH CONTRAST TECHNIQUE: Multidetector CT imaging of the abdomen and pelvis was performed using the standard protocol following bolus administration of intravenous contrast. CONTRAST:  100mL OMNIPAQUE IOHEXOL 300 MG/ML  SOLN COMPARISON:  None. FINDINGS: Lower chest: No acute abnormality. Hepatobiliary: No solid liver abnormality is seen. No gallstones, gallbladder wall thickening, or biliary dilatation. Pancreas: Unremarkable. No pancreatic ductal dilatation or surrounding inflammatory changes. Spleen: Normal in size without significant abnormality. Adrenals/Urinary Tract: Adrenal glands are unremarkable. Kidneys are normal, without renal calculi, solid lesion, or hydronephrosis. Bladder is unremarkable. Stomach/Bowel: Stomach is within normal limits. Appendix is not visualized in the right lower quadrant. No evidence of bowel wall thickening, distention, or inflammatory changes. Vascular/Lymphatic: No significant vascular findings are present. No enlarged abdominal or pelvic lymph nodes. Reproductive: There are fluid-filled structures in the the low pelvis, which are likely loops of bowel. The ovaries and adnexa are not clearly visualized (e.g. Series 2, image 70, series 5, image 49). Other: No abdominal wall hernia or abnormality. Small volume free fluid in the low pelvis. Musculoskeletal: No acute or significant osseous findings. IMPRESSION: 1. The appendix is not clearly visualized in the right lower quadrant. Acute appendicitis is neither included or excluded. Repeat CT with the administration of oral enteric contrast may be helpful for localization. 2. There are fluid-filled structures in the the low pelvis, which are likely loops of bowel. The ovaries and adnexa are not clearly visualized (  e.g. Series 2, image 70, series 5, image 49). Hydrosalpinx and/or ovarian lesions are differential  considerations. Dedicated pelvic ultrasound may be helpful to further evaluate depending upon clinical suspicion. 3. Small volume nonspecific free fluid in the low pelvis, which may be functional in the reproductive age setting or reactive. Electronically Signed   By: Lauralyn PrimesAlex  Bibbey M.D.   On: 05/18/2019 17:47   [place] Art/ven Flow Abd Pelv Doppler  Result Date: 05/18/2019 CLINICAL DATA:  Pelvic pain EXAM: TRANSABDOMINAL AND TRANSVAGINAL ULTRASOUND OF PELVIS DOPPLER ULTRASOUND OF OVARIES TECHNIQUE: Both transabdominal and transvaginal ultrasound examinations of the pelvis were performed. Transabdominal technique was performed for global imaging of the pelvis including uterus, ovaries, adnexal regions, and pelvic cul-de-sac. It was necessary to proceed with endovaginal exam following the transabdominal exam to visualize the ovaries. Color and duplex Doppler ultrasound was utilized to evaluate blood flow to the ovaries. COMPARISON:  None. FINDINGS: Uterus Measurements: 7.4 x 3.5 x 3.8 cm = volume: 52 mL. No fibroids or other mass visualized. Endometrium Thickness: 5 mm.  No focal abnormality visualized. Right ovary Measurements: 5.2 x 4.4 by 3 cm = volume: 35 mL. There is a serpiginous fluid-filled structure in the right adnexal region. The ovaries are somewhat indistinct. Left ovary Measurements: 6.2 x 4.4 by 4.5 cm = volume: 63 mL. There is a serpiginous fluid-filled structure in the left adnexal region. The ovaries are somewhat indistinct. Pulsed Doppler evaluation of both ovaries demonstrates normal low-resistance arterial and venous waveforms. Other findings There is a small amount of free fluid in the pelvis. IMPRESSION: 1. Overall findings suspicious for bilateral tubo-ovarian abscesses in the appropriate clinical setting. 2. No definite sonographic evidence for ovarian torsion. Electronically Signed   By: Katherine Mantlehristopher  Green M.D.   On: 05/18/2019 19:10    Assessment/Plan: 18 yo with bilateral TOA - Admit to  med-surg - Will start IV antibiotics - Follow up gonorrhea/chlamydia culture - pain management prn - antiemetics prn  Kittie Krizan 05/19/2019, 5:33 PM

## 2019-05-20 ENCOUNTER — Encounter (HOSPITAL_COMMUNITY): Payer: Self-pay | Admitting: *Deleted

## 2019-05-20 ENCOUNTER — Other Ambulatory Visit: Payer: Self-pay

## 2019-05-20 LAB — GC/CHLAMYDIA PROBE AMP (~~LOC~~) NOT AT ARMC
Chlamydia: POSITIVE — AB
Neisseria Gonorrhea: POSITIVE — AB

## 2019-05-20 LAB — HIV ANTIBODY (ROUTINE TESTING W REFLEX): HIV Screen 4th Generation wRfx: NONREACTIVE

## 2019-05-20 LAB — CBC
HCT: 25.7 % — ABNORMAL LOW (ref 36.0–46.0)
Hemoglobin: 8.5 g/dL — ABNORMAL LOW (ref 12.0–15.0)
MCH: 27.7 pg (ref 26.0–34.0)
MCHC: 33.1 g/dL (ref 30.0–36.0)
MCV: 83.7 fL (ref 80.0–100.0)
Platelets: 324 10*3/uL (ref 150–400)
RBC: 3.07 MIL/uL — ABNORMAL LOW (ref 3.87–5.11)
RDW: 18.7 % — ABNORMAL HIGH (ref 11.5–15.5)
WBC: 15.7 10*3/uL — ABNORMAL HIGH (ref 4.0–10.5)
nRBC: 0 % (ref 0.0–0.2)

## 2019-05-20 LAB — RPR: RPR Ser Ql: NONREACTIVE

## 2019-05-20 MED ORDER — DOXYCYCLINE HYCLATE 100 MG PO TABS
100.0000 mg | ORAL_TABLET | Freq: Two times a day (BID) | ORAL | Status: DC
  Administered 2019-05-20 – 2019-05-21 (×3): 100 mg via ORAL
  Filled 2019-05-20 (×3): qty 1

## 2019-05-20 MED ORDER — SODIUM CHLORIDE 0.9 % IV SOLN
2.0000 g | Freq: Two times a day (BID) | INTRAVENOUS | Status: DC
  Administered 2019-05-20 – 2019-05-21 (×3): 2 g via INTRAVENOUS
  Filled 2019-05-20 (×4): qty 2

## 2019-05-20 NOTE — Progress Notes (Signed)
[facility] OB[facility]N Attending Note  Subjective:  Patient reports improved abdominal pain, but hates having IV antibiotics.  "IV site hurts"  Admitted on 05/19/2019 for TOA (tubo-ovarian abscess).    Objective:  Blood pressure 116/64, pulse 89, temperature 100 F (37.8 C), temperature source Oral, resp. rate 16, height 5\' 2"  (1.575 m), weight 61.5 kg, last menstrual period 04/28/2019, SpO2 100 %. Gen: NAD HENT: Normocephalic, atraumatic Lungs: Normal respiratory effort Heart: Regular rate noted Abdomen: moderate tenderness, soft, no rebound/guarding Cervix: Deferred Ext: No edema, no cyanosis, negative Homan's sign  Results for orders placed or performed during the hospital encounter of 05/19/19 (from the past 48 hour(s))  CBC     Status: Abnormal   Collection Time: 05/20/19  1:57 AM  Result Value Ref Range   WBC 15.7 (H) 4.0 - 10.5 K/uL   RBC 3.07 (L) 3.87 - 5.11 MIL/uL   Hemoglobin 8.5 (L) 12.0 - 15.0 g/dL   HCT 25.7 (L) 36.0 - 46.0 %   MCV 83.7 80.0 - 100.0 fL   MCH 27.7 26.0 - 34.0 pg   MCHC 33.1 30.0 - 36.0 g/dL   RDW 18.7 (H) 11.5 - 15.5 %   Platelets 324 150 - 400 K/uL   nRBC 0.0 0.0 - 0.2 %    Comment: Performed at [facility], [address] [place], [place] [address]    Assessment & Plan:  18 y.o. [phone] admitted for bilateral TOA, also has Trichomonal vaginitis *Will switch from Cefoxitin to Cefotetan (q6h -q12h), switched Doxycycline to oral form. Continue IV antibiotics until at least 24 hours afebrile, before switching to oral regimen. IV team to evaluate IV site.  *Metronidazole ordered for Trichomonas. Patient informed. Other STI tests pending.  Recommend testing for other STIs, also needs to let partner(s) know so the partner(s) can get testing and treatment. Patient and sex partner(s) should abstain from unprotected sexual activity for seven days after everyone receives appropriate treatment.  Patient will repeat test of cure in 4 weeks.    *Analgesia as needed.  *Continue close observation.  Verita Schneiders, MD, [place] for [employer], [place]

## 2019-05-20 NOTE — Plan of Care (Signed)
  Problem: Education: Goal: Knowledge of General Education information will improve Description Including pain rating scale, medication(s)/side effects and non-pharmacologic comfort measures Outcome: Progressing   

## 2019-05-20 NOTE — Progress Notes (Signed)
Patient c/o IV site burning and pain.No redness or infiltration noted and no backflow. She refused IV restart and stated she will speak with MD to change IV antibiotics to po.

## 2019-05-21 ENCOUNTER — Encounter: Payer: Self-pay | Admitting: Obstetrics & Gynecology

## 2019-05-21 DIAGNOSIS — A749 Chlamydial infection, unspecified: Secondary | ICD-10-CM

## 2019-05-21 DIAGNOSIS — A549 Gonococcal infection, unspecified: Secondary | ICD-10-CM

## 2019-05-21 HISTORY — DX: Gonococcal infection, unspecified: A54.9

## 2019-05-21 HISTORY — DX: Chlamydial infection, unspecified: A74.9

## 2019-05-21 LAB — HEPATITIS C ANTIBODY (REFLEX): HCV Ab: 0.2 s/co ratio (ref 0.0–0.9)

## 2019-05-21 LAB — HCV COMMENT:

## 2019-05-21 LAB — HEPATITIS B SURFACE ANTIGEN: Hepatitis B Surface Ag: NEGATIVE

## 2019-05-21 MED ORDER — METRONIDAZOLE 500 MG PO TABS: 500.0000 mg | ORAL_TABLET | Freq: Two times a day (BID) | ORAL | 0 refills | Status: DC

## 2019-05-21 MED ORDER — DOXYCYCLINE HYCLATE 100 MG PO TABS: 100.0000 mg | ORAL_TABLET | Freq: Two times a day (BID) | ORAL | 0 refills | Status: DC

## 2019-05-21 MED ORDER — IBUPROFEN 600 MG PO TABS: 600.0000 mg | ORAL_TABLET | Freq: Four times a day (QID) | ORAL | 0 refills | Status: DC | PRN

## 2019-05-21 MED FILL — METRONIDAZOLE 500 MG TABS: 500 | 14 days supply | Qty: 28 | Fill #0

## 2019-05-21 MED FILL — IBUPROFEN 600 MG TABLET: 600 | 7 days supply | Qty: 30 | Fill #0

## 2019-05-21 MED FILL — DOXYCYCLINE HYCLATE 100 MG: 100 | 14 days supply | Qty: 28 | Fill #0

## 2019-05-21 NOTE — Discharge Instructions (Signed)
Pelvic Inflammatory Disease ° °Pelvic inflammatory disease (PID) is caused by an infection in some or all of the female reproductive organs. The infection can be in the uterus, ovaries, fallopian tubes, or the surrounding tissues in the pelvis. PID can cause abdominal or pelvic pain that comes on suddenly (acute pelvic pain). °PID is a serious infection because it can lead to lasting (chronic) pelvic pain or the inability to have children (infertility). °What are the causes? °This condition is most often caused by bacteria that is spread during sexual contact. It can also be caused by a bacterial infection of the vagina (bacterial vaginosis) that is not spread by sexual contact. °This condition occurs when the infection is not treated and the bacteria travel upward from the vagina or cervix into the reproductive organs. Bacteria may also be introduced into the reproductive organs following: °· The birth of a baby. °· A miscarriage. °· An abortion. °· Major pelvic surgery. °· The insertion of an intrauterine device (IUD). °· A sexual assault. °What increases the risk? °You are more likely to develop this condition if you: °· Are younger than 19 years of age. °· Are sexually active at a young age. °· Have a history of STI (sexually transmitted infection) or PID. °· Do not regularly use barrier contraception methods, such as condoms. °· Have multiple sexual partners. °· Have sex with someone who has symptoms of an STI. °· Use a vaginal douche. °· Have recently had an IUD inserted. °What are the signs or symptoms? °Symptoms of this condition include: °· Abdominal or pelvic pain. °· Fever. °· Chills. °· Abnormal vaginal discharge. °· Abnormal uterine bleeding. °· Unusual pain shortly after the end of a menstrual period. °· Painful urination. °· Pain with sex. °· Nausea and vomiting. °How is this diagnosed? °This condition is diagnosed based on a pelvic exam and medical history. A pelvic exam can reveal signs of  infection, inflammation, and discharge in the vagina and the surrounding tissues. It can also help to identify painful areas. You may also have tests, such as: °· Lab tests, including a pregnancy test, blood tests, and a urine test. °· Culture tests of the vagina and cervix to check for an STI. °· Ultrasound. °· A laparoscopic procedure to look inside the pelvis. °· Examination of vaginal discharge under a microscope. °How is this treated? °This condition may be treated with: °· Antibiotic medicines taken by mouth (orally). For more severe cases, antibiotics may be given through an IV at the hospital. °· Surgery. This is rare. Surgery may be needed if other treatments do not help. °· Efforts to stop the spread of the infection. Sexual partners may need to be treated if the infection is caused by an STI. °It may take weeks until you are completely well. If you are diagnosed with PID, you should also be checked for HIV (human immunodeficiency virus). Your health care provider may test you for infection again 3 months after treatment. You should not have unprotected sex. °Follow these instructions at home: °· Take over-the-counter and prescription medicines only as told by your health care provider. °· If you were prescribed an antibiotic medicine, take it as told by your health care provider. Do not stop using the antibiotic even if you start to feel better. °· Do not have sex until treatment is completed or as told by your health care provider. If PID is confirmed, your recent sexual partners will need treatment, especially if you had unprotected sex. °· Keep all   follow-up visits as told by your health care provider. This is important. °Contact a health care provider if: °· You have increased or abnormal vaginal discharge. °· Your pain does not improve. °· You vomit. °· You have a fever. °· You cannot tolerate your medicines. °· Your partner has an STI. °· You have pain when you urinate. °Get help right away  if: °· You have increased abdominal or pelvic pain. °· You have chills. °· Your symptoms are not better in 72 hours with treatment. °Summary °· Pelvic inflammatory disease (PID) is caused by an infection in some or all of the female reproductive organs. °· PID is a serious infection because it can lead to lasting (chronic) pelvic pain or the inability to have children (infertility). °· This infection is usually treated with antibiotic medicines. °· Do not have sex until treatment is completed or as told by your health care provider. °This information is not intended to replace advice given to you by your health care provider. Make sure you discuss any questions you have with your health care provider. °Document Released: 11/06/2005 Document Revised: 07/25/2018 Document Reviewed: 07/30/2018 °Elsevier Patient Education © 2020 [employer] ° °

## 2019-05-21 NOTE — Discharge Summary (Addendum)
OB Discharge Summary     Patient Name: Jennifer Doyle DOB: 2001-09-22 MRN: [id number]  Date of admission: 05/19/2019 Delivering MD: This patient has no babies on file.  Date of discharge: 05/21/2019  Admitting diagnosis: Tubal ovarian abscess Intrauterine pregnancy: Unknown     Secondary diagnosis:  Principal Problem:   TOA (tubo-ovarian abscess) Active Problems:   Trichomonal vaginitis     Discharge diagnosis: bilateral TOA,                                                                                                 Hospital course:  18 yo admitted with bilateral TOA, nausea ans emesis. Patient received IV antibitics, remained afebrile for 48 hours. Her lower abdominal pain is non existent. Patient found stable for discharge. She was a given a 14-day course of Flagyl and Doxy with plans to follow up in the office in 2 weeks. Discharge instructions reviewed with the patient. Patient was informed of positive gonorrhea/chlamydia/trich infection. Patient was and being treated for all positive STI and was advised to inform partner in order for them to receive treatment.  Physical exam  Vitals:   05/20/19 0508 05/20/19 1334 05/20/19 2026 05/21/19 0548  BP: 116/64 129/84 136/87 121/74  Pulse: 89 98 81 93  Resp: 16 17 18 18   Temp: 100 F (37.8 C) 98.2 F (36.8 C) 98.8 F (37.1 C) 98.9 F (37.2 C)  TempSrc: Oral Oral    SpO2: 100% 100% 100% 100%  Weight:      Height:       GENERAL: Well-developed, well-nourished female in no acute distress.  LUNGS: Clear to auscultation bilaterally.  HEART: Regular rate and rhythm. BREASTS: Symmetric in size. No palpable masses or lymphadenopathy, skin changes, or nipple drainage. ABDOMEN: Soft, nontender, nondistended. No organomegaly. EXTREMITIES: No cyanosis, clubbing, or edema, 2+ distal pulses.  Labs: Lab Results  Component Value Date   WBC 15.7 (H) 05/20/2019   HGB 8.5 (L) 05/20/2019   HCT 25.7 (L) 05/20/2019   MCV 83.7 05/20/2019    PLT 324 05/20/2019   CMP Latest Ref Rng & Units 05/18/2019  Glucose 70 - 99 mg/dL 82  BUN 6 - 20 mg/dL 12  Creatinine 0.44 - 1.00 mg/dL 0.70  Sodium 135 - 145 mmol/L 136  Potassium 3.5 - 5.1 mmol/L 3.1(L)  Chloride 98 - 111 mmol/L 104  CO2 22 - 32 mmol/L 20(L)  Calcium 8.9 - 10.3 mg/dL 9.0  Total Protein 6.5 - 8.1 g/dL 8.2(H)  Total Bilirubin 0.3 - 1.2 mg/dL 0.5  Alkaline Phos 38 - 126 U/L 57  AST 15 - 41 U/L 11(L)  ALT 0 - 44 U/L 8      After visit meds:  Allergies as of 05/21/2019      Reactions   Peanut-containing Drug Products Swelling   Throat swelling      Medication List    TAKE these medications   doxycycline 100 MG tablet Commonly known as: VIBRA-TABS Take 1 tablet (100 mg total) by mouth every 12 (twelve) hours.   ibuprofen 600 MG tablet Commonly known as: ADVIL Take  1 tablet (600 mg total) by mouth every 6 (six) hours as needed (mild pain). What changed:   medication strength  how much to take  reasons to take this   metroNIDAZOLE 500 MG tablet Commonly known as: Flagyl Take 1 tablet (500 mg total) by mouth 2 (two) times daily.       Diet: routine diet  Activity: Advance as tolerated. Pelvic rest for 6 weeks.   Outpatient follow up:2 weeks Follow up Appt:No future appointments. Follow up Visit:No follow-ups on file.  Postpartum contraception: Undecided   05/21/2019 Catalina AntiguaPeggy Shaianne Nucci, MD

## 2019-05-21 NOTE — Progress Notes (Signed)
Patient discharged to home with instructions. 

## 2019-06-09 ENCOUNTER — Telehealth: Payer: Self-pay | Admitting: Obstetrics and Gynecology

## 2019-06-09 NOTE — Telephone Encounter (Signed)
Attempted to call patient to get her scheduled. Left a detailed message for her to call us back. We need to discuss appointment information with her.

## 2019-06-12 ENCOUNTER — Ambulatory Visit: Payer: Medicaid Other | Admitting: Obstetrics & Gynecology

## 2019-06-12 ENCOUNTER — Other Ambulatory Visit: Payer: Self-pay

## 2019-06-12 NOTE — Progress Notes (Signed)
Called pt for appt and someone picked up the phone and phone disconnected.  Attempted to call back and LM that I am calling in regards to her appt if she could call the office.

## 2019-06-12 NOTE — Progress Notes (Signed)
She hung up on [place]

## 2019-10-05 ENCOUNTER — Other Ambulatory Visit: Payer: Self-pay

## 2019-10-05 ENCOUNTER — Encounter (HOSPITAL_BASED_OUTPATIENT_CLINIC_OR_DEPARTMENT_OTHER): Payer: Self-pay | Admitting: Emergency Medicine

## 2019-10-05 ENCOUNTER — Emergency Department (HOSPITAL_BASED_OUTPATIENT_CLINIC_OR_DEPARTMENT_OTHER)
Admission: EM | Admit: 2019-10-05 | Discharge: 2019-10-05 | Disposition: A | Discharge status: Home / Self Care | Payer: Medicaid Other | Attending: Emergency Medicine | Admitting: Emergency Medicine

## 2019-10-05 DIAGNOSIS — Z91010 Allergy to peanuts: Secondary | ICD-10-CM | POA: Diagnosis not present

## 2019-10-05 DIAGNOSIS — F17210 Nicotine dependence, cigarettes, uncomplicated: Secondary | ICD-10-CM | POA: Insufficient documentation

## 2019-10-05 DIAGNOSIS — F17290 Nicotine dependence, other tobacco product, uncomplicated: Secondary | ICD-10-CM | POA: Insufficient documentation

## 2019-10-05 DIAGNOSIS — K0889 Other specified disorders of teeth and supporting structures: Secondary | ICD-10-CM | POA: Diagnosis present

## 2019-10-05 DIAGNOSIS — K029 Dental caries, unspecified: Secondary | ICD-10-CM

## 2019-10-05 MED ORDER — PENICILLIN V POTASSIUM 500 MG PO TABS: 500.0000 mg | ORAL_TABLET | Freq: Four times a day (QID) | ORAL | 0 refills | Status: AC

## 2019-10-05 MED ORDER — NAPROXEN 500 MG PO TABS: 500.0000 mg | ORAL_TABLET | Freq: Two times a day (BID) | ORAL | 0 refills | Status: DC

## 2019-10-05 MED ORDER — NAPROXEN 250 MG PO TABS
500.0000 mg | ORAL_TABLET | Freq: Once | ORAL | Status: AC
  Administered 2019-10-05: 10:00:00 500 mg via ORAL
  Filled 2019-10-05: qty 2

## 2019-10-05 NOTE — ED Provider Notes (Signed)
[facility] EMERGENCY DEPARTMENT Provider Note   CSN: [id number] Arrival date & time: 10/05/19  9562     History   Chief Complaint Chief Complaint  Patient presents with  . Dental Pain    HPI Jennifer Doyle is a 18 y.o. female presenting to the emergency department via EMS for right-sided lower dental pain that began yesterday.  Patient states it was mild and intermittent that worsened this morning around 5:30 AM.  She treated with Tylenol.  Pain persists and therefore she called EMS for a ride to the ED for evaluation.  She denies difficulty swallowing or breathing, fever, drainage in her mouth, or any other associated symptoms.  She does not currently have a dentist.  Patient is confident she is not pregnant.     The history is provided by the patient.    Past Medical History:  Diagnosis Date  . Chlamydia 05/21/2019  . Gonorrhea 05/21/2019  . MDD (major depressive disorder), single episode, severe , no psychosis ([place]) 07/27/2017  . Suicidal ideation 07/27/2017    Patient Active Problem List   Diagnosis Date Noted  . Gonorrhea 05/21/2019  . Chlamydia 05/21/2019  . TOA (tubo-ovarian abscess) 05/19/2019  . Trichomonal vaginitis 05/19/2019  . Suicidal ideation 07/27/2017  . MDD (major depressive disorder), recurrent severe, without psychosis ([place]) 07/27/2017    Past Surgical History:  Procedure Laterality Date  . NO PAST SURGERIES       OB History    Gravida  1   Para  1   Term  0   Preterm  1   AB      Living  1     SAB      TAB      Ectopic      Multiple      Live Births               Home Medications    Prior to Admission medications   Medication Sig Start Date End Date Taking? Authorizing Provider  doxycycline (VIBRA-TABS) 100 MG tablet Take 1 tablet (100 mg total) by mouth every 12 (twelve) hours. 05/21/19   Constant, Peggy, MD  ibuprofen (ADVIL) 600 MG tablet Take 1 tablet (600 mg total) by mouth every 6 (six) hours as needed (mild  pain). 05/21/19   Constant, Peggy, MD  metroNIDAZOLE (FLAGYL) 500 MG tablet Take 1 tablet (500 mg total) by mouth 2 (two) times daily. 05/21/19   Constant, Peggy, MD  naproxen (NAPROSYN) 500 MG tablet Take 1 tablet (500 mg total) by mouth 2 (two) times daily. 10/05/19   Jayani Rozman, [place] N, PA-C  penicillin v potassium (VEETID) 500 MG tablet Take 1 tablet (500 mg total) by mouth 4 (four) times daily for 7 days. 10/05/19 10/12/19  Zoey Gilkeson, [place] N, PA-C    Family History History reviewed. No pertinent family history.  Social History Social History   Tobacco Use  . Smoking status: Current Every Day Smoker    Types: Cigarettes, Cigars  . Smokeless tobacco: Never Used  . Tobacco comment: 1 "Black" a day  Substance Use Topics  . Alcohol use: Not Currently  . Drug use: No     Allergies   Peanut-containing drug products   Review of Systems Review of Systems  Constitutional: Negative for fever.  HENT: Positive for dental problem.      Physical Exam Updated Vital Signs BP 138/85 (BP Location: Right Arm)   Pulse 67   Temp 98.7 F (37.1 C) (Oral)  Resp 18   Ht 5\' 3"  (1.6 m)   Wt 68 kg   SpO2 100%   BMI 26.57 kg/m   Physical Exam Vitals signs and nursing note reviewed.  Constitutional:      General: She is not in acute distress.    Appearance: She is well-developed.  HENT:     Head: Normocephalic and atraumatic.     Mouth/Throat:     Comments: Dental caries present to multiple molars. Right lower last molar w TTP. No large decay, no gingival erythema or fluctuance.  No sublingual edema or tenderness.  Uvula is midline, tolerating secretions.  No trismus. Eyes:     Conjunctiva/sclera: Conjunctivae normal.  Neck:     Musculoskeletal: Normal range of motion and neck supple. No muscular tenderness.  Pulmonary:     Effort: Pulmonary effort is normal.  Lymphadenopathy:     Cervical: No cervical adenopathy.  Neurological:     Mental Status: She is alert.  Psychiatric:         Mood and Affect: Mood normal.        Behavior: Behavior normal.      ED Treatments / Results  Labs (all labs ordered are listed, but only abnormal results are displayed) Labs Reviewed - No data to display  EKG None  Radiology No results found.  Procedures Procedures (including critical care time)  Medications Ordered in ED Medications  naproxen (NAPROSYN) tablet 500 mg (has no administration in time range)     Initial Impression / Assessment and Plan / ED Course  I have reviewed the triage vital signs and the nursing notes.  Pertinent labs & imaging results that were available during my care of the patient were reviewed by me and considered in my medical decision making (see chart for details).        Patient with dental caries.  No gross abscess.  VSS, afebrile, tolerating secretions. Exam unconcerning for peritonsillar abscess, Ludwig's angina or spread of infection.  Will treat with penicillin and pain medicine.  Urged patient to follow-up with dentist. Pt safe for discharge.  Discussed results, findings, treatment and follow up. Patient advised of return precautions. Patient verbalized understanding and agreed with plan.   Final Clinical Impressions(s) / ED Diagnoses   Final diagnoses:  Pain due to dental caries    ED Discharge Orders         Ordered    penicillin v potassium (VEETID) 500 MG tablet  4 times daily     10/05/19 0958    naproxen (NAPROSYN) 500 MG tablet  2 times daily     10/05/19 0958           Zane Samson, 10/07/19 N, PA-C 10/05/19 1002    10/07/19, MD 10/06/19 [id number]

## 2019-10-05 NOTE — Discharge Instructions (Addendum)
Please read instructions below. °Take the antibiotic, Penicillin V, 4 times per day until they are gone. °You can take Naproxen up to 2 times per day with meals, as needed for pain. °Schedule an appointment with a dentist, using the dental resource guide attached. °Return to the ER for difficulty swallowing or breathing, fever, or new or worsening symptoms. ° °

## 2019-10-05 NOTE — ED Triage Notes (Signed)
Pt here with dental pain starting this morning. She stated she took tylenol last night, but she did not have pain last night. Requesting something for pain.

## 2019-11-03 ENCOUNTER — Emergency Department (HOSPITAL_BASED_OUTPATIENT_CLINIC_OR_DEPARTMENT_OTHER)
Admission: EM | Admit: 2019-11-03 | Discharge: 2019-11-03 | Disposition: A | Discharge status: Home / Self Care | Payer: Medicaid Other | Attending: Emergency Medicine | Admitting: Emergency Medicine

## 2019-11-03 ENCOUNTER — Encounter (HOSPITAL_BASED_OUTPATIENT_CLINIC_OR_DEPARTMENT_OTHER): Payer: Self-pay | Admitting: *Deleted

## 2019-11-03 ENCOUNTER — Other Ambulatory Visit: Payer: Self-pay

## 2019-11-03 ENCOUNTER — Emergency Department (HOSPITAL_BASED_OUTPATIENT_CLINIC_OR_DEPARTMENT_OTHER): Payer: Medicaid Other

## 2019-11-03 DIAGNOSIS — F17290 Nicotine dependence, other tobacco product, uncomplicated: Secondary | ICD-10-CM | POA: Insufficient documentation

## 2019-11-03 DIAGNOSIS — R0602 Shortness of breath: Secondary | ICD-10-CM | POA: Insufficient documentation

## 2019-11-03 DIAGNOSIS — Z91010 Allergy to peanuts: Secondary | ICD-10-CM | POA: Diagnosis not present

## 2019-11-03 DIAGNOSIS — F17210 Nicotine dependence, cigarettes, uncomplicated: Secondary | ICD-10-CM | POA: Diagnosis not present

## 2019-11-03 DIAGNOSIS — Z20828 Contact with and (suspected) exposure to other viral communicable diseases: Secondary | ICD-10-CM | POA: Diagnosis not present

## 2019-11-03 MED ORDER — ALBUTEROL SULFATE HFA 108 (90 BASE) MCG/ACT IN AERS
1.0000 | INHALATION_SPRAY | Freq: Once | RESPIRATORY_TRACT | Status: AC
  Administered 2019-11-03: 21:00:00 1 via RESPIRATORY_TRACT
  Filled 2019-11-03: qty 6.7

## 2019-11-03 NOTE — ED Triage Notes (Signed)
Headache and SOB.

## 2019-11-03 NOTE — ED Provider Notes (Signed)
[facility] EMERGENCY DEPARTMENT Provider Note   CSN: [id number] Arrival date & time: 11/03/19  1935     History Chief Complaint  Patient presents with  . Shortness of Breath    Daleisa Chauca is a 18 y.o. female presents today for evaluation of acute onset, resolved episode of shortness of breath.  Reports symptoms began while at work.  She states she works at [employer] and while she was around [employer] she began to feel [place] short of breath.  Symptoms resolved after she removed herself from the room and sat down for a minute or 2.  Denies any chest pain, fever, nausea, vomiting, congestion, sore throat, abdominal pain.  No known sick contacts.  She reports that she currently smokes 2 black in miles daily.  Reports that she has had similar symptoms previously with significant improvement with an albuterol inhaler but ran out of this.  The history is provided by the patient.       Past Medical History:  Diagnosis Date  . Chlamydia 05/21/2019  . Gonorrhea 05/21/2019  . MDD (major depressive disorder), single episode, severe , no psychosis ([facility]) 07/27/2017  . Suicidal ideation 07/27/2017    Patient Active Problem List   Diagnosis Date Noted  . Gonorrhea 05/21/2019  . Chlamydia 05/21/2019  . TOA (tubo-ovarian abscess) 05/19/2019  . Trichomonal vaginitis 05/19/2019  . Suicidal ideation 07/27/2017  . 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 NO PAST SURGERIES       OB History    Gravida  1   Para  1   Term  0   Preterm  1   AB      Living  1     SAB      TAB      Ectopic      Multiple      Live Births              No family history on file.  Social History   Tobacco Use  . Smoking status: Current Every Day Smoker    Types: Cigarettes, Cigars  . Smokeless tobacco: Never Used  . Tobacco comment: 1 "Black" a day  Substance Use Topics    . Alcohol use: Not Currently  . Drug use: No    Home Medications Prior to Admission medications   Medication Sig Start Date End Date Taking? Authorizing Provider  doxycycline (VIBRA-TABS) 100 MG tablet Take 1 tablet (100 mg total) by mouth every 12 (twelve) hours. 05/21/19   Constant, Peggy, MD  ibuprofen (ADVIL) 600 MG tablet Take 1 tablet (600 mg total) by mouth every 6 (six) hours as needed (mild pain). 05/21/19   Constant, Peggy, MD  metroNIDAZOLE (FLAGYL) 500 MG tablet Take 1 tablet (500 mg total) by mouth 2 (two) times daily. 05/21/19   Constant, Peggy, MD  naproxen (NAPROSYN) 500 MG tablet Take 1 tablet (500 mg total) by mouth 2 (two) times daily. 10/05/19   Robinson, [place] N, PA-C    Allergies    Peanut-containing drug products  Review of Systems   Review of Systems  Constitutional: Negative for chills and fever.  HENT: Negative for congestion and sore throat.   Respiratory: Positive for shortness of breath. Negative for cough.   Cardiovascular: Negative for chest pain.  Gastrointestinal: Negative for abdominal pain, nausea and vomiting.  All other systems reviewed and are negative.   Physical Exam Updated  Vital Signs BP (!) 150/84   Pulse 83   Temp 97.8 F (36.6 C) (Oral)   Resp 20   Ht 5\' 2"  (1.575 m)   Wt 70.8 kg   LMP 11/02/2019   SpO2 100%   BMI 28.53 kg/m   Physical Exam Vitals and nursing note reviewed.  Constitutional:      General: She is not in acute distress.    Appearance: She is well-developed.     Comments: Resting comfortably in bed  HENT:     Head: Normocephalic and atraumatic.  Eyes:     General:        Right eye: No discharge.        Left eye: No discharge.     Conjunctiva/sclera: Conjunctivae normal.  Neck:     Vascular: No JVD.     Trachea: No tracheal deviation.  Cardiovascular:     Rate and Rhythm: Normal rate and regular rhythm.  Pulmonary:     Effort: Pulmonary effort is normal.     Breath sounds: Normal breath sounds.      Comments: Resting comfortably in bed, equal rise and fall of chest.  Speaking in full sentences without difficulty, SPO2 saturations 98 to 100% on room air Abdominal:     General: Bowel sounds are normal. There is no distension.     Palpations: Abdomen is soft.     Tenderness: There is no abdominal tenderness.  Musculoskeletal:     Right lower leg: No tenderness. No edema.     Left lower leg: No tenderness. No edema.  Skin:    General: Skin is warm and dry.     Findings: No erythema.  Neurological:     Mental Status: She is alert.  Psychiatric:        Behavior: Behavior normal.     ED Results / Procedures / Treatments   Labs (all labs ordered are listed, but only abnormal results are displayed) Labs Reviewed  NOVEL CORONAVIRUS, NAA (HOSP ORDER, SEND-OUT TO REF LAB; TAT 18-24 HRS)    EKG None  Radiology No results found.  Procedures Procedures (including critical care time)  Medications Ordered in ED Medications  albuterol (VENTOLIN HFA) 108 (90 Base) MCG/ACT inhaler 1 puff (has no administration in time range)    ED Course  I have reviewed the triage vital signs and the nursing notes.  Pertinent labs & imaging results that were available during my care of the patient were reviewed by me and considered in my medical decision making (see chart for details).    MDM Rules/Calculators/A&P                      Nonna Carby was evaluated in Emergency Department on 11/03/2019 for the symptoms described in the history of present illness. She was evaluated in the context of the global COVID-19 pandemic, which necessitated consideration that the patient might be at risk for infection with the SARS-CoV-2 virus that causes COVID-19. Institutional protocols and algorithms that pertain to the evaluation of patients at risk for COVID-19 are in a state of rapid change based on information released by regulatory bodies including the CDC and federal and state organizations. These  policies and algorithms were followed during the patient's care in the ED.  Patient presenting for evaluation of one episode of shortness of breath while at work today that has since resolved.  She is afebrile, vital signs are stable.  She is nontoxic in appearance.  She has no  fever or infectious symptoms.  Her lungs are clear to auscultation bilaterally.  Reports that she is had similar episodes and has previously had an albuterol inhaler with improvement but ran out of the albuterol inhaler.  No signs of respiratory distress on assessment today.  She is overall quite well-appearing.  Doubt bacterial pneumonia, pneumothorax, PE, MI, dissection or cardiac tamponade.  We will test her for Covid using outpatient test, discussed quarantining at home per current CDC guidelines.  Recommend follow-up with PCP for reevaluation of symptoms.  Will refill her albuterol inhaler.  Discussed strict ED return precautions. Pt verbalized understanding of and agreement with plan and is safe for discharge home at this time.  Final Clinical Impression(s) / ED Diagnoses Final diagnoses:  Shortness of breath    Rx / DC Orders ED Discharge Orders    None       Debroah Baller 11/03/19 2035    Gareth Morgan, MD 11/04/19 1303

## 2019-11-03 NOTE — Discharge Instructions (Signed)
Start using albuterol inhaler 1 to 2 puffs every 4-6 hours as needed for shortness of breath.  During plenty fluids and get plenty of rest.  Your Covid test will result within 24 hours.  You will receive a phone call if your test is positive, no phone call if your test is negative.  You can also review your results on MyChart.  See the attached instructions for how to access my chart.  If your test is positive you will need to quarantine at home for the next 14 days.  If your test is negative, contact your employer for further recommendations.  Return to the emergency department if any concerning signs or symptoms develop such as persistent or worsening shortness of breath, chest pains, high fevers, persistent vomiting, or loss of consciousness

## 2019-11-03 NOTE — ED Notes (Signed)
Pt states she was feeling SOB while at work today, states she works at [employer] makes her SOB, denies SOB at present, chest pain or other symptoms, denies loss of smell/taste or fever

## 2019-11-05 ENCOUNTER — Telehealth: Payer: Self-pay

## 2019-11-05 LAB — NOVEL CORONAVIRUS, NAA (HOSP ORDER, SEND-OUT TO REF LAB; TAT 18-24 HRS): SARS-CoV-2, NAA: NOT DETECTED

## 2019-11-05 NOTE — Telephone Encounter (Signed)
Received call from patient checking Covid results.  Advised no results at this time.   

## 2019-12-13 IMAGING — US US PELVIS COMPLETE
1 series · 13 of 25 positions shown · non-contrast
Comparison: None.

CLINICAL DATA: Pelvic pain

EXAM:
TRANSABDOMINAL AND TRANSVAGINAL ULTRASOUND OF PELVIS
DOPPLER ULTRASOUND OF OVARIES
TECHNIQUE: Both transabdominal and transvaginal ultrasound examinations of the
pelvis were performed. Transabdominal technique was performed for
global imaging of the pelvis including uterus, ovaries, adnexal
regions, and pelvic cul-de-sac.
It was necessary to proceed with endovaginal exam following the
transabdominal exam to visualize the ovaries. Color and duplex
Doppler ultrasound was utilized to evaluate blood flow to the
ovaries.

[Series 1: us pelvis complete · 83 acquisitions, 13 frames shown]
[im 1/83]
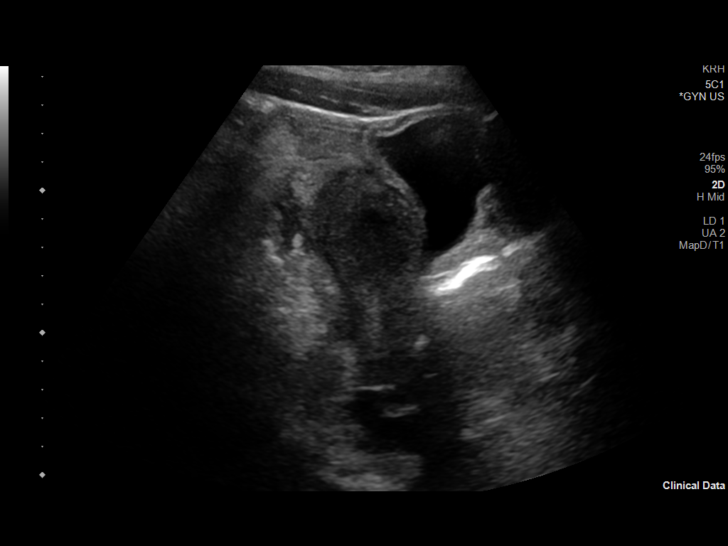
[im 7/83]
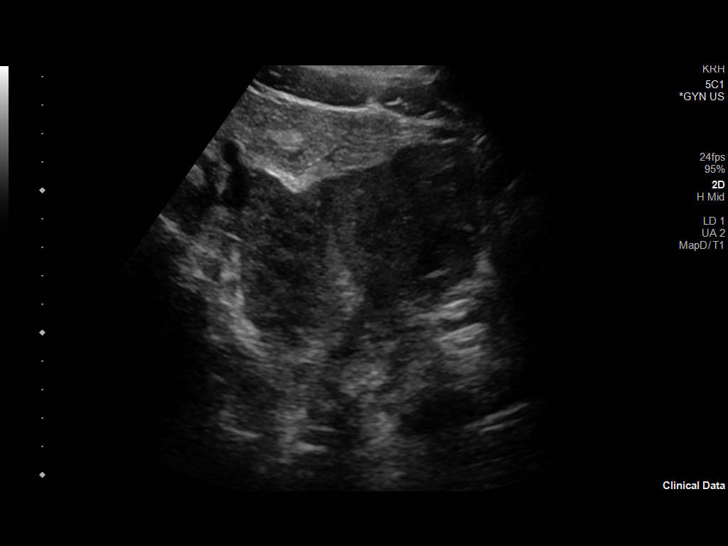
[im 14/83]
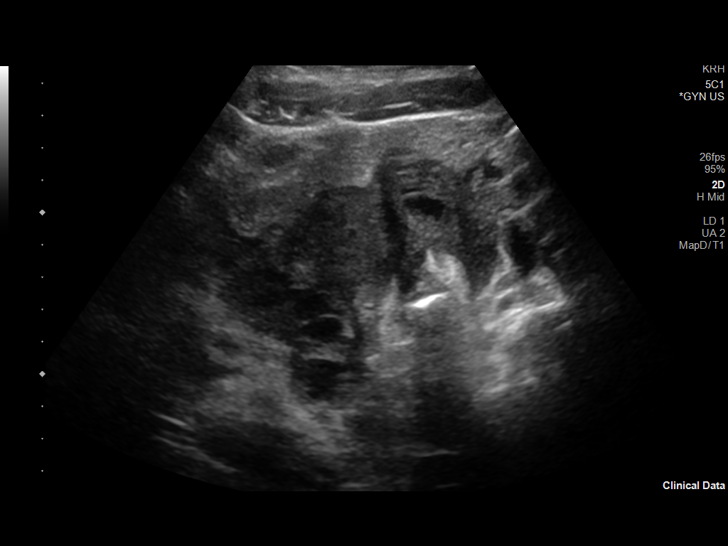
[im 21/83]
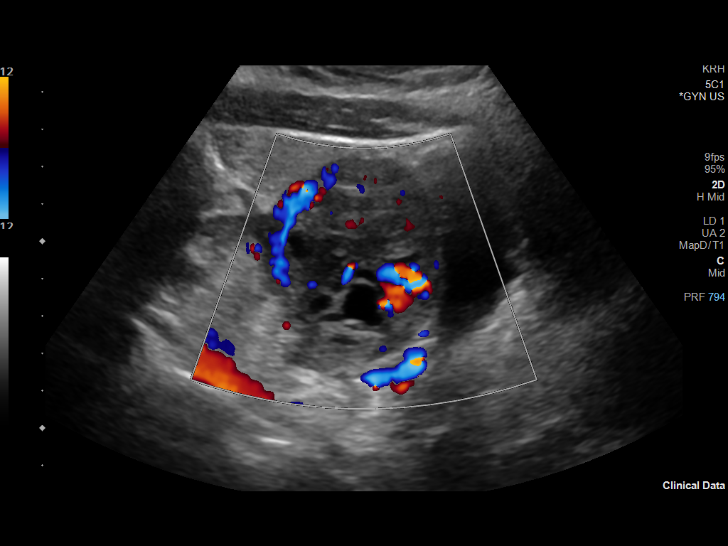
[im 28/83]
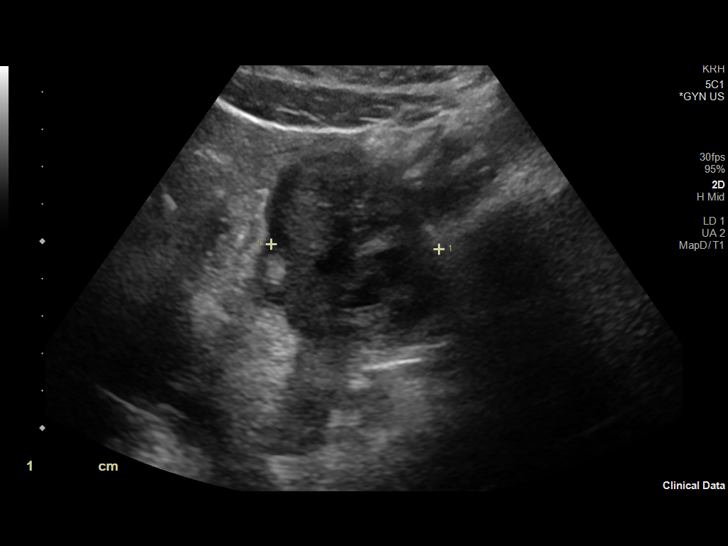
[im 35/83]
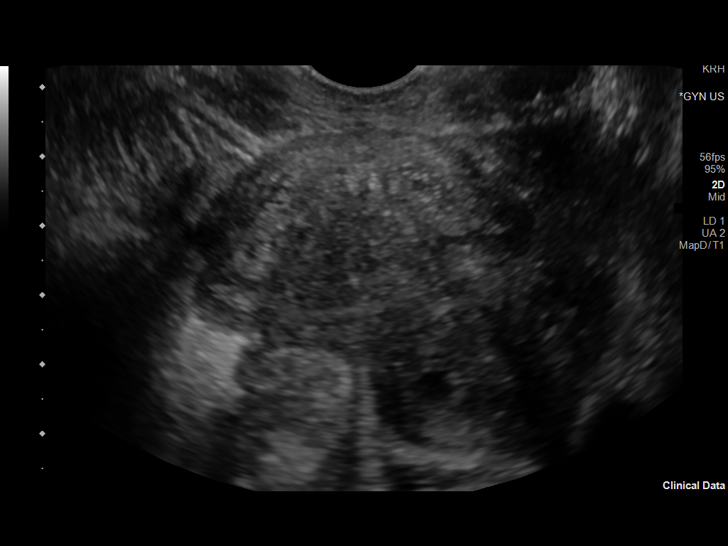
[im 42/83]
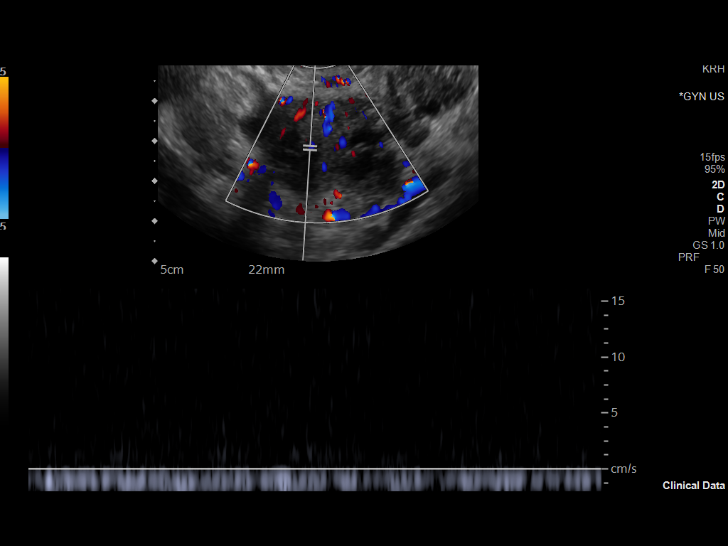
[im 48/83]
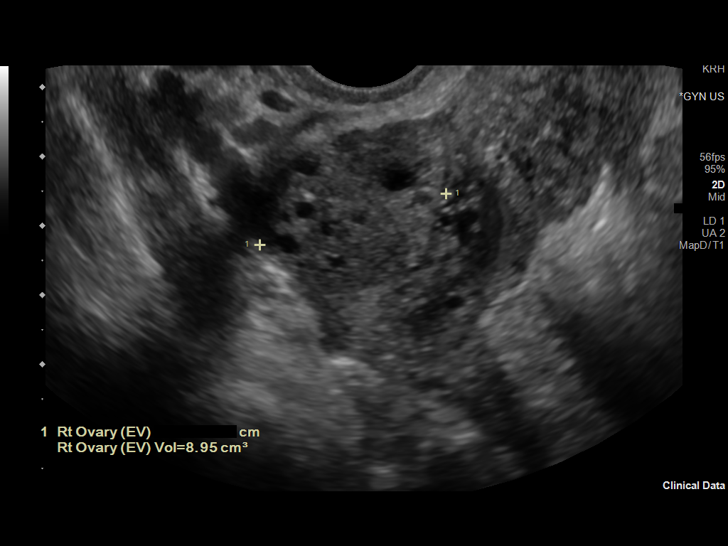
[im 55/83]
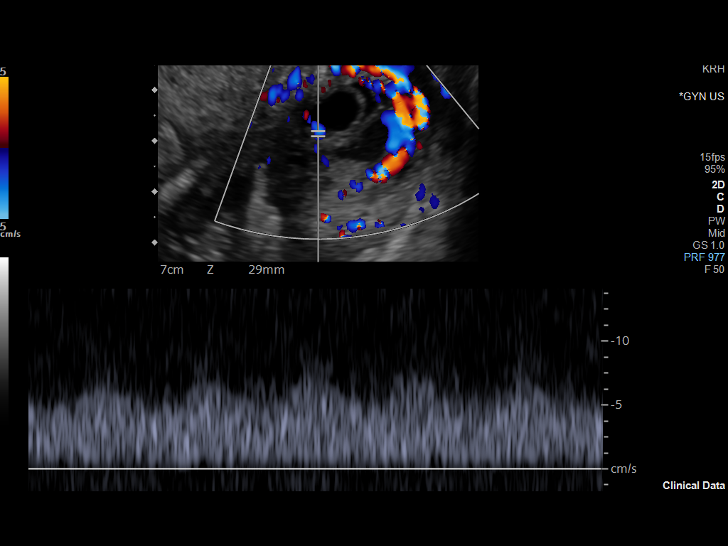
[im 62/83]
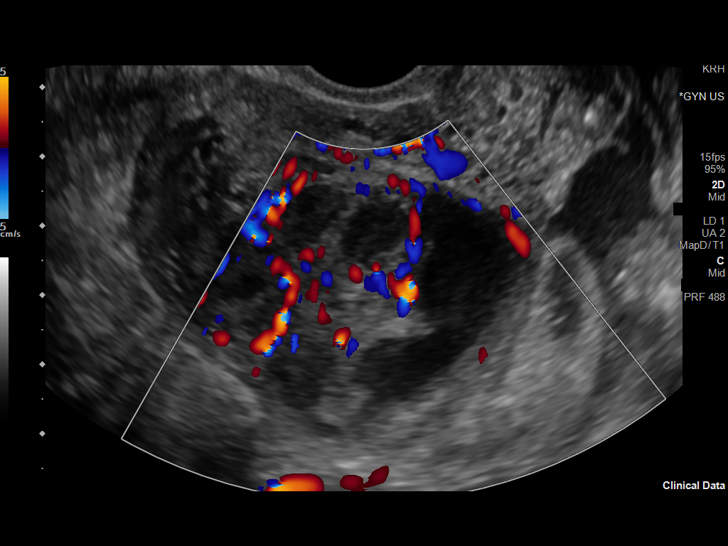
[im 69/83]
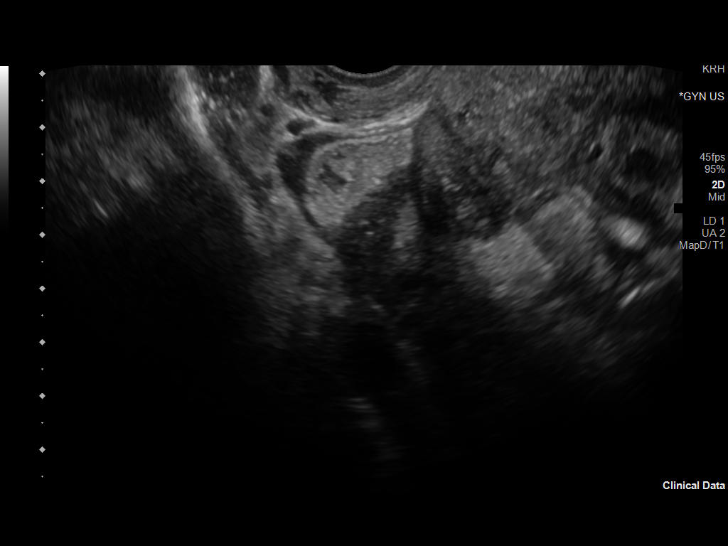
[im 76/83]
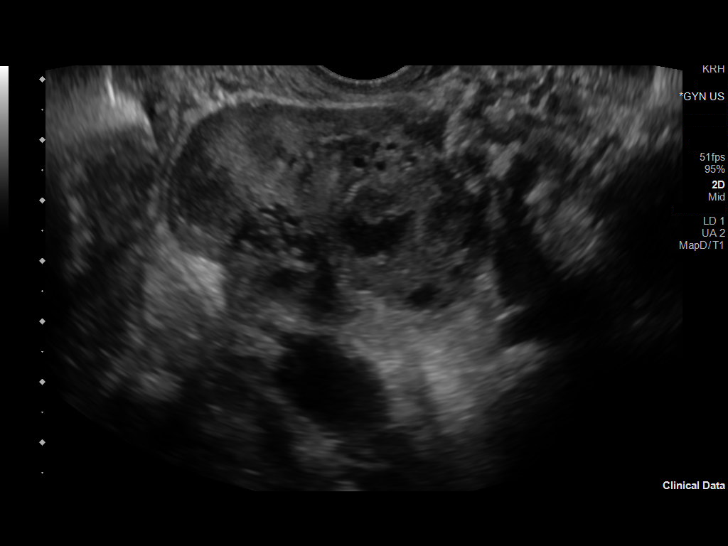
[im 83/83]
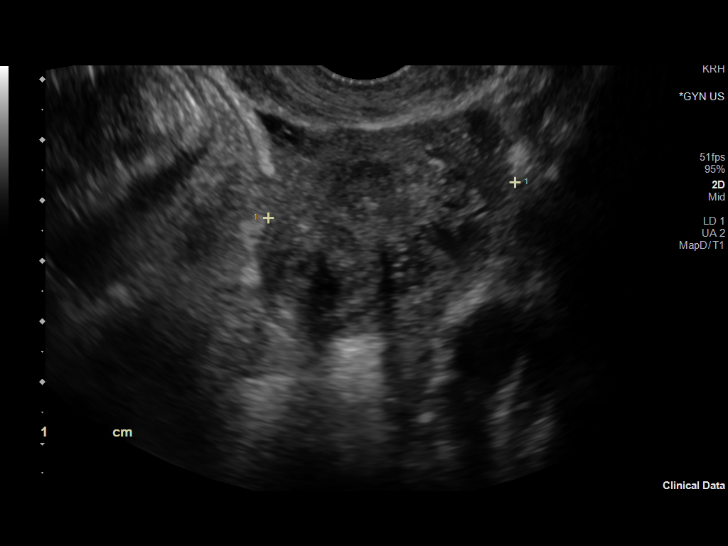

[13 of 25 positions shown; findings below may reference images not displayed]

FINDINGS: Uterus

Measurements: 7.4 x 3.5 x 3.8 cm = volume: 52 mL. No fibroids or
other mass visualized.

Endometrium

Thickness: 5 mm.  No focal abnormality visualized.

Right ovary

Measurements: 5.2 x 4.4 by 3 cm = volume: 35 mL. There is a
serpiginous fluid-filled structure in the right adnexal region. The
ovaries are somewhat indistinct.

Left ovary

Measurements: 6.2 x 4.4 by 4.5 cm = volume: 63 mL. There is a
serpiginous fluid-filled structure in the left adnexal region. The
ovaries are somewhat indistinct.

Pulsed Doppler evaluation of both ovaries demonstrates normal
low-resistance arterial and venous waveforms.

Other findings

There is a small amount of free fluid in the pelvis.
IMPRESSION: 1. Overall findings suspicious for bilateral tubo-ovarian abscesses
in the appropriate clinical setting.
2. No definite sonographic evidence for ovarian torsion.

## 2019-12-13 IMAGING — CT CT ABDOMEN AND PELVIS WITH CONTRAST
2 of 4 series · 16 of 46 positions shown, 18 images · IV contrast (APPLIED)
Comparison: None.

CLINICAL DATA: Lower abdominal pain, appendicitis suspected

EXAM:
CT ABDOMEN AND PELVIS WITH CONTRAST
TECHNIQUE: Multidetector CT imaging of the abdomen and pelvis was performed
using the standard protocol following bolus administration of
intravenous contrast.
CONTRAST:  100mL OMNIPAQUE IOHEXOL 300 MG/ML  SOLN

[Series 2: axial st · axial · 0.68mm/px · z∈[-462,-32]mm · 13 of 94 slices shown, 15 images]
[im 4/94  soft-tissue]
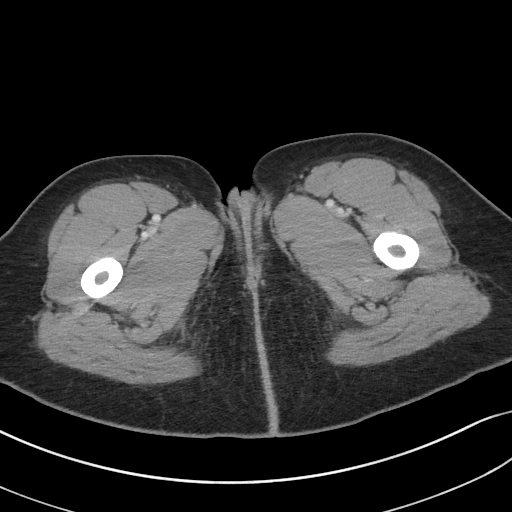
[im 4/94  bone]
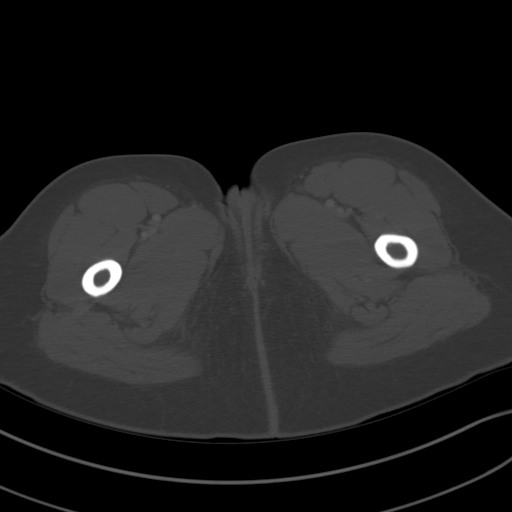
[im 12/94  soft-tissue]
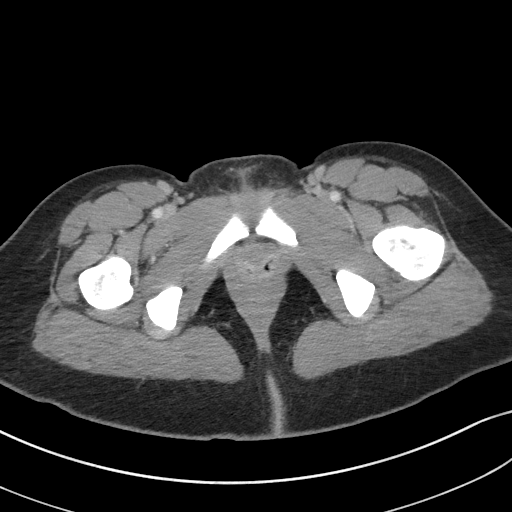
[im 19/94  soft-tissue]
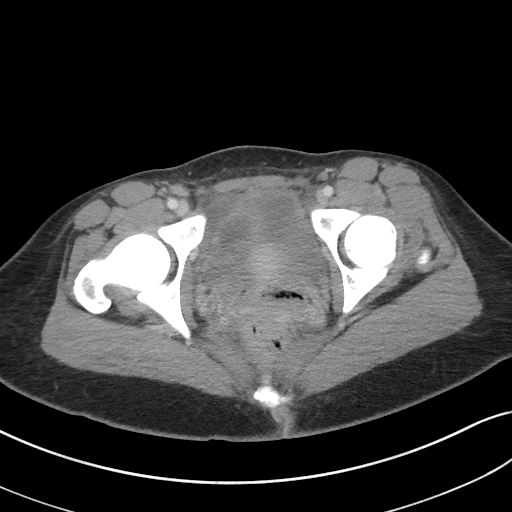
[im 27/94  soft-tissue]
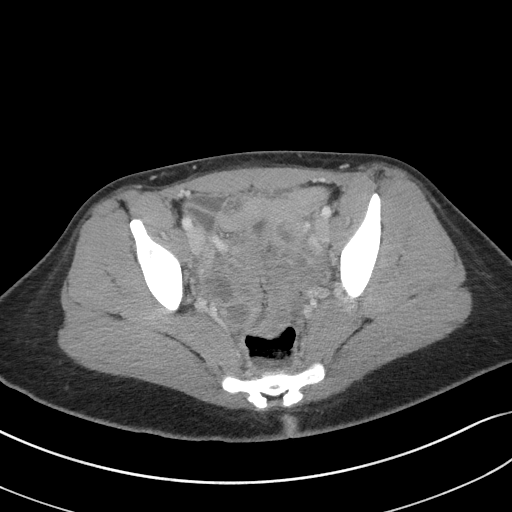
[im 34/94  soft-tissue]
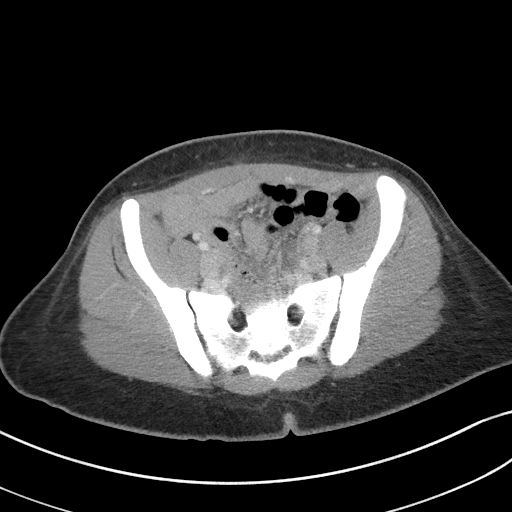
[im 41/94  soft-tissue]
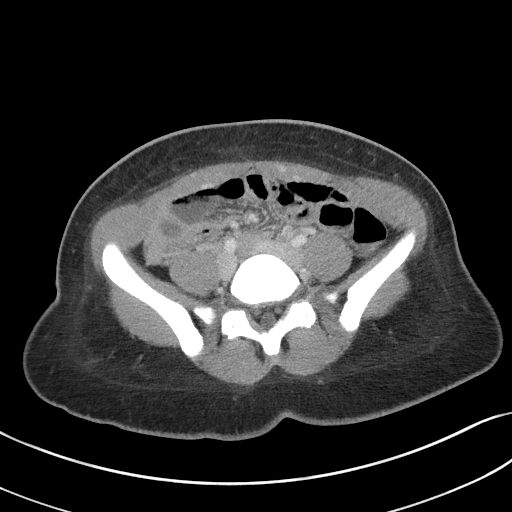
[im 49/94  soft-tissue]
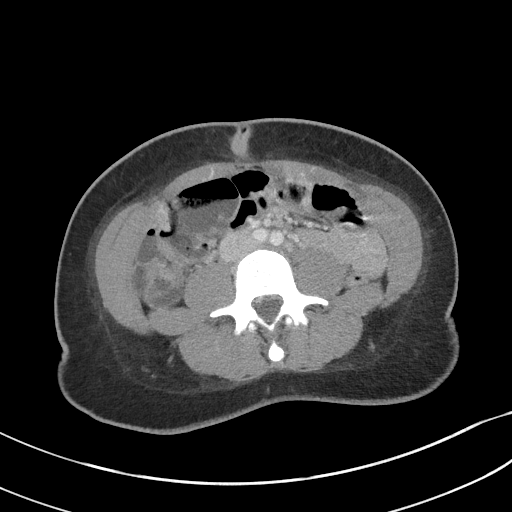
[im 53/94  soft-tissue]
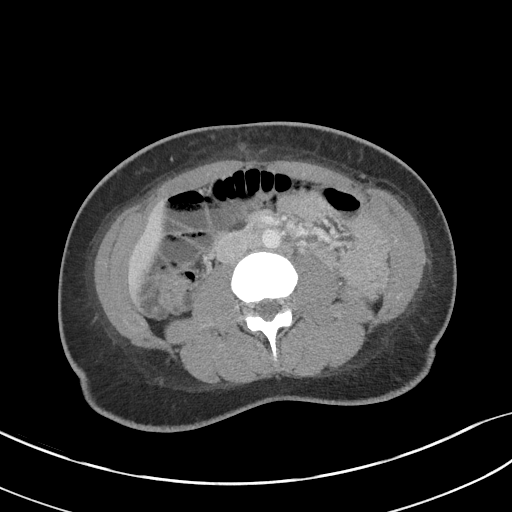
[im 60/94  soft-tissue]
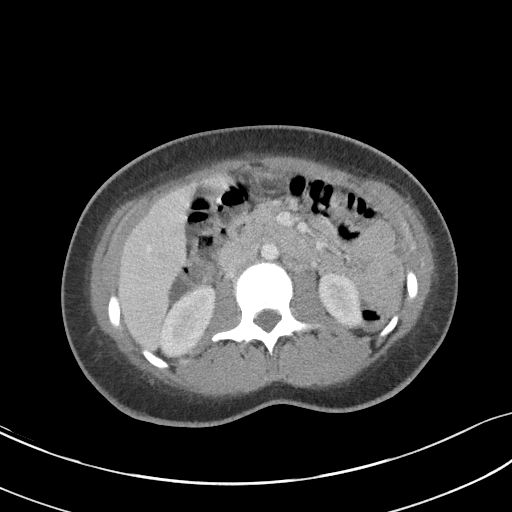
[im 60/94  bone]
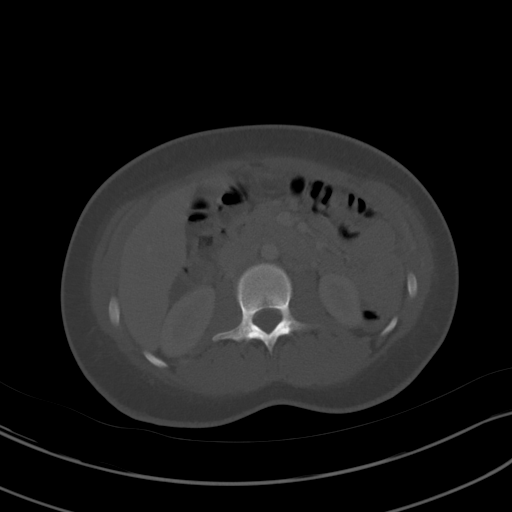
[im 67/94  soft-tissue]
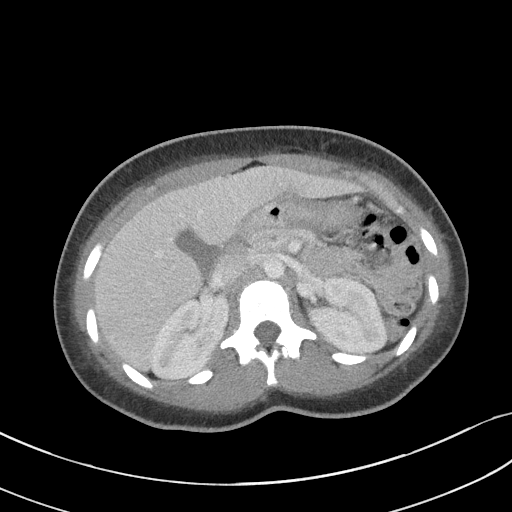
[im 75/94  soft-tissue]
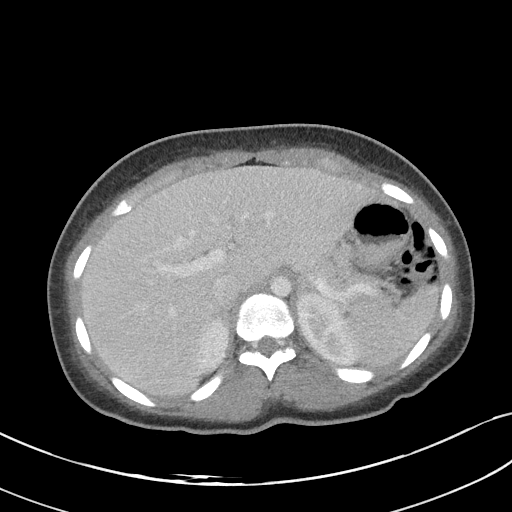
[im 82/94  soft-tissue]
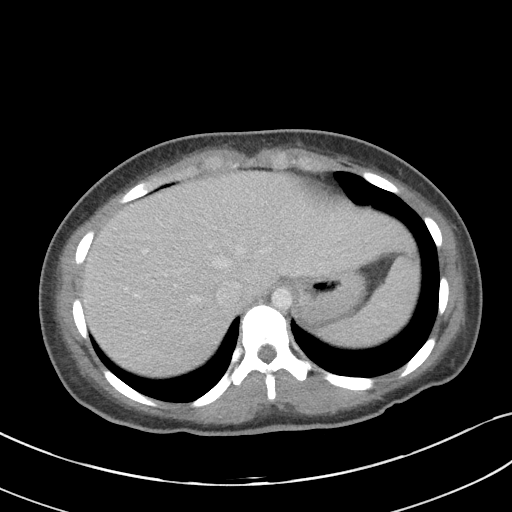
[im 90/94  soft-tissue]
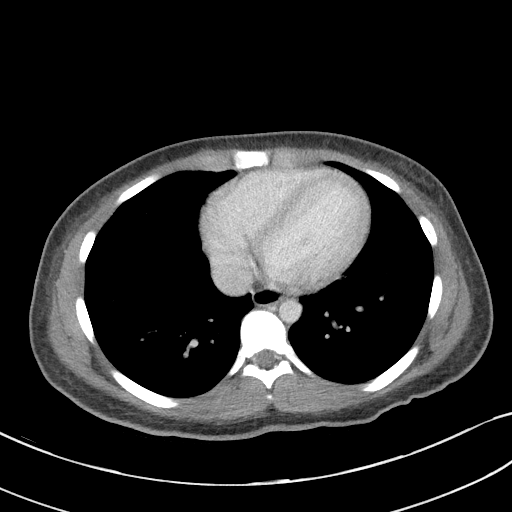

[Series 5: coronal st · coronal · 0.84mm/px · 3 of 101 slices shown]
[im 34/101  soft-tissue]
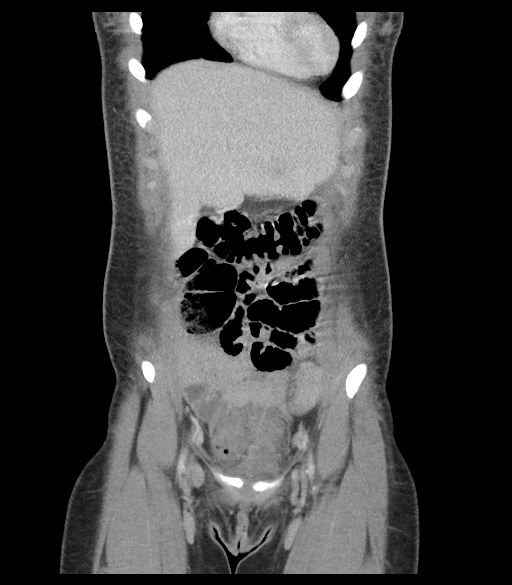
[im 45/101  soft-tissue]
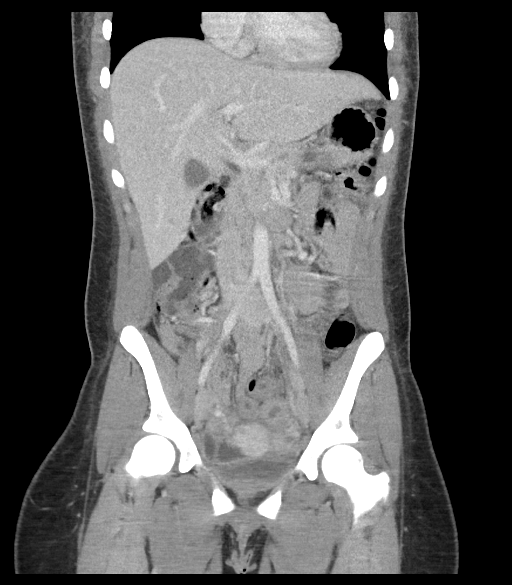
[im 56/101  soft-tissue]
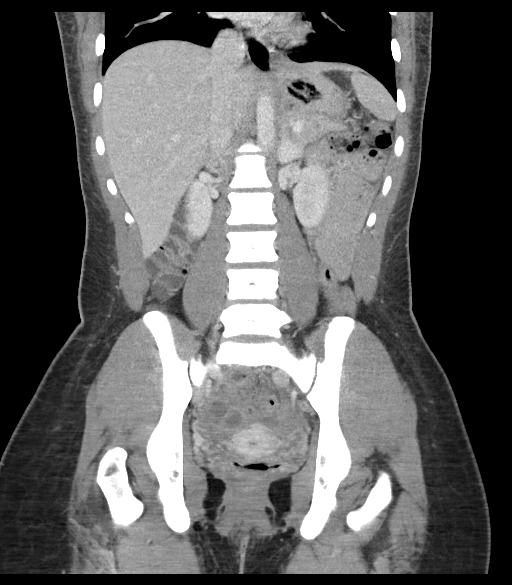

[16 of 46 positions shown; findings below may reference images not displayed]

FINDINGS: Lower chest: No acute abnormality.

Hepatobiliary: No solid liver abnormality is seen. No gallstones,
gallbladder wall thickening, or biliary dilatation.

Pancreas: Unremarkable. No pancreatic ductal dilatation or
surrounding inflammatory changes.

Spleen: Normal in size without significant abnormality.

Adrenals/Urinary Tract: Adrenal glands are unremarkable. Kidneys are
normal, without renal calculi, solid lesion, or hydronephrosis.
Bladder is unremarkable.

Stomach/Bowel: Stomach is within normal limits. Appendix is not
visualized in the right lower quadrant. No evidence of bowel wall
thickening, distention, or inflammatory changes.

Vascular/Lymphatic: No significant vascular findings are present. No
enlarged abdominal or pelvic lymph nodes.

Reproductive: There are fluid-filled structures in the the low
pelvis, which are likely loops of bowel. The ovaries and adnexa are
not clearly visualized (e.g. Series 2, image 70, series 5, image
49).

Other: No abdominal wall hernia or abnormality. Small volume free
fluid in the low pelvis.

Musculoskeletal: No acute or significant osseous findings.
IMPRESSION: 1. The appendix is not clearly visualized in the right lower
quadrant. Acute appendicitis is neither included or excluded. Repeat
CT with the administration of oral enteric contrast may be helpful
for localization.

2. There are fluid-filled structures in the the low pelvis, which
are likely loops of bowel. The ovaries and adnexa are not clearly
visualized (e.g. Series 2, image 70, series 5, image 49).
Hydrosalpinx and/or ovarian lesions are differential considerations.
Dedicated pelvic ultrasound may be helpful to further evaluate
depending upon clinical suspicion.

3. Small volume nonspecific free fluid in the low pelvis, which may
be functional in the reproductive age setting or reactive.

## 2020-08-18 ENCOUNTER — Emergency Department (HOSPITAL_COMMUNITY)
Admission: EM | Admit: 2020-08-18 | Discharge: 2020-08-18 | Disposition: A | Discharge status: Home / Self Care | Payer: Medicaid Other | Attending: Emergency Medicine | Admitting: Emergency Medicine

## 2020-08-18 ENCOUNTER — Other Ambulatory Visit: Payer: Self-pay

## 2020-08-18 ENCOUNTER — Encounter (HOSPITAL_COMMUNITY): Payer: Self-pay

## 2020-08-18 ENCOUNTER — Emergency Department (HOSPITAL_COMMUNITY): Payer: Medicaid Other

## 2020-08-18 DIAGNOSIS — S40012A Contusion of left shoulder, initial encounter: Secondary | ICD-10-CM | POA: Insufficient documentation

## 2020-08-18 DIAGNOSIS — Z5321 Procedure and treatment not carried out due to patient leaving prior to being seen by health care provider: Secondary | ICD-10-CM | POA: Diagnosis not present

## 2020-08-18 DIAGNOSIS — W19XXXA Unspecified fall, initial encounter: Secondary | ICD-10-CM | POA: Insufficient documentation

## 2020-08-18 NOTE — ED Triage Notes (Signed)
Pt presents with c/o fall that happened 5 days ago. Pt does not recall when or how the incident happened as she was intoxicated for her birthday. Pt has a bruise on her left shoulder and is c/o shoulder pain.

## 2020-08-18 NOTE — ED Notes (Signed)
Eloped from waiting area. Called 3X.  

## 2020-10-12 ENCOUNTER — Encounter (HOSPITAL_COMMUNITY): Payer: Self-pay | Admitting: Emergency Medicine

## 2020-10-12 ENCOUNTER — Other Ambulatory Visit: Payer: Self-pay

## 2020-10-12 ENCOUNTER — Ambulatory Visit (HOSPITAL_COMMUNITY)
Admission: EM | Admit: 2020-10-12 | Discharge: 2020-10-12 | Disposition: A | Discharge status: Home / Self Care | Payer: Medicaid Other | Attending: Emergency Medicine | Admitting: Emergency Medicine

## 2020-10-12 DIAGNOSIS — A6004 Herpesviral vulvovaginitis: Secondary | ICD-10-CM | POA: Insufficient documentation

## 2020-10-12 DIAGNOSIS — A64 Unspecified sexually transmitted disease: Secondary | ICD-10-CM | POA: Insufficient documentation

## 2020-10-12 DIAGNOSIS — N898 Other specified noninflammatory disorders of vagina: Secondary | ICD-10-CM | POA: Insufficient documentation

## 2020-10-12 LAB — POCT URINALYSIS DIPSTICK, ED / UC
Glucose, UA: NEGATIVE mg/dL
Hgb urine dipstick: NEGATIVE
Ketones, ur: 40 mg/dL — AB
Nitrite: NEGATIVE
Protein, ur: 30 mg/dL — AB
Specific Gravity, Urine: 1.025 (ref 1.005–1.030)
Urobilinogen, UA: 1 mg/dL (ref 0.0–1.0)
pH: 6 (ref 5.0–8.0)

## 2020-10-12 LAB — HIV ANTIBODY (ROUTINE TESTING W REFLEX): HIV Screen 4th Generation wRfx: NONREACTIVE

## 2020-10-12 LAB — POC URINE PREG, ED: Preg Test, Ur: NEGATIVE

## 2020-10-12 MED ORDER — METRONIDAZOLE 500 MG PO TABS: 500.0000 mg | ORAL_TABLET | Freq: Two times a day (BID) | ORAL | 0 refills | Status: AC

## 2020-10-12 MED ORDER — METRONIDAZOLE 500 MG PO TABS
ORAL_TABLET | ORAL | Status: AC
  Filled 2020-10-12: qty 1

## 2020-10-12 MED ORDER — ONDANSETRON 4 MG PO TBDP
4.0000 mg | ORAL_TABLET | Freq: Once | ORAL | Status: AC
  Administered 2020-10-12: 4 mg via ORAL

## 2020-10-12 MED ORDER — METRONIDAZOLE 500 MG PO TABS
500.0000 mg | ORAL_TABLET | Freq: Once | ORAL | Status: AC
  Administered 2020-10-12: 500 mg via ORAL

## 2020-10-12 MED ORDER — CEFTRIAXONE SODIUM 500 MG IJ SOLR
500.0000 mg | Freq: Once | INTRAMUSCULAR | Status: AC
  Administered 2020-10-12: 500 mg via INTRAMUSCULAR

## 2020-10-12 MED ORDER — DOXYCYCLINE HYCLATE 100 MG PO CAPS: 100.0000 mg | ORAL_CAPSULE | Freq: Two times a day (BID) | ORAL | 0 refills | Status: AC

## 2020-10-12 MED ORDER — LIDOCAINE HCL (PF) 1 % IJ SOLN
INTRAMUSCULAR | Status: AC
  Filled 2020-10-12: qty 2

## 2020-10-12 MED ORDER — DOXYCYCLINE HYCLATE 100 MG PO TABS
ORAL_TABLET | ORAL | Status: AC
  Filled 2020-10-12: qty 1

## 2020-10-12 MED ORDER — ONDANSETRON 4 MG PO TBDP
ORAL_TABLET | ORAL | Status: AC
  Filled 2020-10-12: qty 1

## 2020-10-12 MED ORDER — CEFTRIAXONE SODIUM 500 MG IJ SOLR
INTRAMUSCULAR | Status: AC
  Filled 2020-10-12: qty 500

## 2020-10-12 MED ORDER — VALACYCLOVIR HCL 1 G PO TABS: 1000.0000 mg | ORAL_TABLET | Freq: Two times a day (BID) | ORAL | 1 refills | Status: AC

## 2020-10-12 MED ORDER — DOXYCYCLINE HYCLATE 100 MG PO TABS
100.0000 mg | ORAL_TABLET | Freq: Once | ORAL | Status: AC
  Administered 2020-10-12: 100 mg via ORAL

## 2020-10-12 NOTE — ED Provider Notes (Signed)
[facility]    CSN: [id number] Arrival date & time: 10/12/20  1227      History   Chief Complaint Chief Complaint  Patient presents with  . Vaginitis    HPI Jennifer Doyle is a 19 y.o. female.   The history is provided by the patient. No language interpreter was used.  Vaginal Discharge Quality:  Unable to specify Severity:  Mild Onset quality:  Gradual Timing:  Constant Progression:  Worsening Chronicity:  Recurrent Context: after urination   Context comment:  Burns, hurts really bad, itches and "I think I scratched a place on it" Relieved by:  Nothing Worsened by:  Nothing Associated symptoms: genital lesions, urinary hesitancy and vaginal itching   Associated symptoms: no abdominal pain and no dysuria   Risk factors: PID, STI, STI exposure and unprotected sex   Risk factors: no new sexual partner     Past Medical History:  Diagnosis Date  . Chlamydia 05/21/2019  . Gonorrhea 05/21/2019  . MDD (major depressive disorder), single episode, severe , no psychosis (HCC) 07/27/2017  . Suicidal ideation 07/27/2017    Patient Active Problem List   Diagnosis Date Noted  . STD (sexually transmitted disease) 10/12/2020  . Vaginal discharge 10/12/2020  . 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 Gonorrhea 05/21/2019  . Chlamydia 05/21/2019  . TOA (tubo-ovarian abscess) 05/19/2019  . Trichomonal vaginitis 05/19/2019  . Suicidal ideation 07/27/2017  . 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 NO PAST SURGERIES      OB History    Gravida  1   Para  1   Term  0   Preterm  1   AB      Living  1     SAB      TAB      Ectopic      Multiple      Live Births               Home Medications    Prior to Admission medications   Medication Sig Start Date End Date Taking? Authorizing Provider  doxycycline (VIBRAMYCIN) 100 MG  capsule Take 1 capsule (100 mg total) by mouth 2 (two) times daily for 7 days. 10/12/20 10/19/20  Mayia Megill, Para March, NP  ibuprofen (ADVIL) 600 MG tablet Take 1 tablet (600 mg total) by mouth every 6 (six) hours as needed (mild pain). 05/21/19   Constant, Peggy, MD  metroNIDAZOLE (FLAGYL) 500 MG tablet Take 1 tablet (500 mg total) by mouth 2 (two) times daily for 7 days. 10/12/20 10/19/20  Ana Liaw, Para March, NP  naproxen (NAPROSYN) 500 MG tablet Take 1 tablet (500 mg total) by mouth 2 (two) times daily. 10/05/19   Robinson, [place] N, PA-C  valACYclovir (VALTREX) 1000 MG tablet Take 1 tablet (1,000 mg total) by mouth 2 (two) times daily for 10 days. 10/12/20 10/22/20  Danon Lograsso, Para March, NP    Family History History reviewed. No pertinent family history.  Social History Social History   Tobacco Use  . Smoking status: Current Every Day Smoker    Types: Cigarettes, Cigars  . Smokeless tobacco: Never Used  . Tobacco comment: 1 "Black" a day  Vaping Use  . Vaping Use: Never used  Substance Use Topics  . Alcohol use: Not Currently  . Drug use: No     Allergies   Peanut-containing drug products   Review of Systems Review of  Systems  Gastrointestinal: Negative for abdominal pain.  Genitourinary: Positive for difficulty urinating, genital sores, hesitancy and vaginal discharge. Negative for dysuria.       Pain w urination  All other systems reviewed and are negative.    Physical Exam Triage Vital Signs ED Triage Vitals [10/12/20 1322]  Enc Vitals Group     BP      Pulse      Resp      Temp      Temp src      SpO2      Weight      Height      Head Circumference      Peak Flow      Pain Score 9     Pain Loc      Pain Edu?      Excl. in GC?    No data found.  Updated Vital Signs BP (!) 149/122 (BP Location: Left Arm)   Pulse 94   Temp 98 F (36.7 C) (Oral)   LMP 09/19/2020   SpO2 100%   Visual Acuity Right Eye Distance:   Left Eye Distance:   Bilateral  Distance:    Right Eye Near:   Left Eye Near:    Bilateral Near:     Physical Exam Vitals and nursing note reviewed. Exam conducted with a chaperone present.  Constitutional:      General: She is not in acute distress.    Appearance: Normal appearance. She is well-developed.  HENT:     Head: Normocephalic.  Eyes:     Pupils: Pupils are equal, round, and reactive to light.  Cardiovascular:     Rate and Rhythm: Normal rate and regular rhythm.  Pulmonary:     Effort: Pulmonary effort is normal.     Breath sounds: Normal breath sounds.  Abdominal:     General: Bowel sounds are normal.     Tenderness: There is no abdominal tenderness.  Genitourinary:   Musculoskeletal:        General: Normal range of motion.     Cervical back: Normal range of motion.  Skin:    General: Skin is warm and dry.  Neurological:     Mental Status: She is alert and oriented to person, place, and time.     GCS: GCS eye subscore is 4. GCS verbal subscore is 5. GCS motor subscore is 6.  Psychiatric:        Speech: Speech normal.        Behavior: Behavior normal.      UC Treatments / Results  Labs (all labs ordered are listed, but only abnormal results are displayed) Labs Reviewed  POCT URINALYSIS DIPSTICK, ED / UC - Abnormal; Notable for the following components:      Result Value   Bilirubin Urine MODERATE (*)    Ketones, ur 40 (*)    Protein, ur 30 (*)    Leukocytes,Ua SMALL (*)    All other components within normal limits  HSV CULTURE AND TYPING  HIV ANTIBODY (ROUTINE TESTING W REFLEX)  RPR  POC URINE PREG, ED  CERVICOVAGINAL ANCILLARY ONLY    EKG   Radiology No results found.  Procedures Procedures (including critical care time)  Medications Ordered in UC Medications  cefTRIAXone (ROCEPHIN) injection 500 mg (500 mg Intramuscular Given 10/12/20 1413)  doxycycline (VIBRA-TABS) tablet 100 mg (100 mg Oral Given 10/12/20 1412)  metroNIDAZOLE (FLAGYL) tablet 500 mg (500 mg Oral  Given 10/12/20 1412)  ondansetron (ZOFRAN-ODT) disintegrating  tablet 4 mg (4 mg Oral Given 10/12/20 1411)    Initial Impression / Assessment and Plan / UC Course  I have reviewed the triage vital signs and the nursing notes.  Pertinent labs & imaging results that were available during my care of the patient were reviewed by me and considered in my medical decision making (see chart for details).  Clinical Course as of Oct 12 1625  Tue Oct 12, 2020  1407 Glori Luis): SMALL [JD]    Clinical Course User Index [JD] Antigone Crowell, Para March, NP   DDX: PID,TOA. Discussed extensively with pt need to abstain from sexual activity until labs resulted treated and lesions resolve, scripted antiviral in UC w refill. Condoms with all future sexual activity. Pt verbalized understanding ot this provider. HIV and syphilis pending.  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 You have been diagnosed with herpes, this is treatable but not curable, you can spread this to others unknowingly with viral shedding, protective intercourse and notification to sexual partners(past,present,and future). We are also testing you for HIV and syphilis. Drink plenty of water and take all meds as directed, do not skip or stop taking them. Do not wear tight clothing, cotton underwear will help.     ED Prescriptions    Medication Sig Dispense Auth. Provider   doxycycline (VIBRAMYCIN) 100 MG capsule Take 1 capsule (100 mg total) by mouth 2 (two) times daily for 7 days. 14 capsule Zhoey Blackstock, NP   metroNIDAZOLE (FLAGYL) 500 MG tablet Take 1 tablet (500 mg total) by mouth 2 (two) times daily for 7 days. 14 tablet Cariana Karge, NP   valACYclovir (VALTREX) 1000 MG tablet Take 1 tablet (1,000 mg total) by mouth  2 (two) times daily for 10 days. 20 tablet Kobyn Kray, Para March, NP     PDMP not reviewed this encounter.   Clancy Gourd, NP 10/12/20 1627

## 2020-10-12 NOTE — ED Triage Notes (Signed)
Pt states she recently took an antibiotic from her dentist. She states she stopped the antibiotic because she began having vaginal discharge itching and burning with urination. Pt states that she was scratching a lot and thinks she may have left a small abrasion.

## 2020-10-12 NOTE — Discharge Instructions (Addendum)
Avoid sexual activity until all meds completed. You have been diagnosed with herpes, this is treatable but not curable, you can spread this to others unknowingly with viral shedding, protective intercourse and notification to sexual partners(past,present,and future). We are also testing you for HIV and syphilis. Drink plenty of water and take all meds as directed, do not skip or stop taking them. Do not wear tight clothing, cotton underwear will help.

## 2020-10-13 ENCOUNTER — Telehealth (HOSPITAL_COMMUNITY): Payer: Self-pay | Admitting: Emergency Medicine

## 2020-10-13 LAB — CERVICOVAGINAL ANCILLARY ONLY
Bacterial Vaginitis (gardnerella): POSITIVE — AB
Candida Glabrata: NEGATIVE
Candida Vaginitis: POSITIVE — AB
Chlamydia: NEGATIVE
Comment: NEGATIVE
Comment: NEGATIVE
Comment: NEGATIVE
Comment: NEGATIVE
Comment: NEGATIVE
Comment: NORMAL
Neisseria Gonorrhea: NEGATIVE
Trichomonas: NEGATIVE

## 2020-10-13 LAB — RPR: RPR Ser Ql: NONREACTIVE

## 2020-10-13 MED ORDER — FLUCONAZOLE 150 MG PO TABS: 150.0000 mg | ORAL_TABLET | Freq: Once | ORAL | 0 refills | Status: AC

## 2020-10-14 LAB — HSV CULTURE AND TYPING

## 2020-10-28 ENCOUNTER — Ambulatory Visit: Payer: Self-pay | Admitting: *Deleted

## 2020-10-28 NOTE — Telephone Encounter (Signed)
Patient recently diagnosed with Herpes simplex virus infection of vagina.Taking valacyclovir daily. Reviewed how not to spread herpes to your partner. Stress and illness can cause an outbreak. Take your medication daily. Protect yourself and your partner. May take advil or tylenol for discomfort during an outbreak. Provided [facility] contact information for possible new patient appointment.  Reason for Disposition . Health Information question, no triage required and triager able to answer question  Answer Assessment - Initial Assessment Questions 1. REASON FOR CALL or QUESTION: "What is your reason for calling today?" or "How can I best help you?" or "What question do you have that I can help answer?"     Questions regarding herpes.  Protocols used: INFORMATION ONLY CALL - NO TRIAGE-A-AH

## 2021-10-21 ENCOUNTER — Other Ambulatory Visit: Payer: Self-pay

## 2021-10-21 ENCOUNTER — Ambulatory Visit (INDEPENDENT_AMBULATORY_CARE_PROVIDER_SITE_OTHER): Payer: Medicaid Other

## 2021-10-21 ENCOUNTER — Ambulatory Visit
Admission: EM | Admit: 2021-10-21 | Discharge: 2021-10-21 | Disposition: A | Discharge status: Home / Self Care | Payer: Medicaid Other

## 2021-10-21 DIAGNOSIS — S92325A Nondisplaced fracture of second metatarsal bone, left foot, initial encounter for closed fracture: Secondary | ICD-10-CM | POA: Diagnosis not present

## 2021-10-21 NOTE — ED Triage Notes (Signed)
Pt c/o injury to left foot. 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 A "line" with boxes on it has wheels and it ran over the left foot. There is mild edema and discoloration to the area. Pt states the pain radiates into the left toe 2nd digit.

## 2021-10-21 NOTE — ED Provider Notes (Signed)
[facility]    CSN: [id number] Arrival date & time: 10/21/21  1822      History   Chief Complaint Chief Complaint  Patient presents with   left foot injury    HPI Jennifer Doyle is a 20 y.o. female.   Patient presents with left foot pain that started today after an injury that occurred at work.  Patient reports that "a line" rolled over her foot.  Patient is not able to adequately describe what " a line" is but states that it does have wheels.  She also reports that something fell on the same foot approximately 2 months ago.  Patient having pain in the dorsal surface of the foot near the toes.  Also having pain in the second toe of the left foot.  Denies any numbness or tingling.  Is able to bear weight but with pain.    History reviewed. No pertinent past medical history.  There are no problems to display for this patient.   History reviewed. No pertinent surgical history.  OB History   No obstetric history on file.      Home Medications    Prior to Admission medications   Medication Sig Start Date End Date Taking? Authorizing Provider  ibuprofen (ADVIL) 800 MG tablet Take 800 mg by mouth 3 (three) times daily. 10/15/21   [provider]    Family History History reviewed. No pertinent family history.  Social History Social History   Tobacco Use   Smoking status: Former    Types: Cigarettes   Smokeless tobacco: Never     Allergies   Patient has no allergy information on record.   Review of Systems Review of Systems Per HPI  Physical Exam Triage Vital Signs ED Triage Vitals  Enc Vitals Group     BP 10/21/21 1840 (!) 148/94     Pulse Rate 10/21/21 1840 94     Resp 10/21/21 1840 20     Temp 10/21/21 1840 98 F (36.7 C)     Temp Source 10/21/21 1840 Oral     SpO2 10/21/21 1840 99 %     Weight --      Height --      Head Circumference --      Peak Flow --      Pain Score 10/21/21 1845 7     Pain Loc --      Pain Edu?  --      Excl. in GC? --    No data found.  Updated Vital Signs BP (!) 148/94 (BP Location: Left Arm)   Pulse 94   Temp 98 F (36.7 C) (Oral)   Resp 20   SpO2 99%   Visual Acuity Right Eye Distance:   Left Eye Distance:   Bilateral Distance:    Right Eye Near:   Left Eye Near:    Bilateral Near:     Physical Exam Constitutional:      General: She is not in acute distress.    Appearance: Normal appearance. She is not toxic-appearing or diaphoretic.  HENT:     Head: Normocephalic and atraumatic.  Eyes:     Extraocular Movements: Extraocular movements intact.     Conjunctiva/sclera: Conjunctivae normal.  Pulmonary:     Effort: Pulmonary effort is normal.  Musculoskeletal:     Right foot: Normal.     Left foot: Normal range of motion and normal capillary refill. Swelling, tenderness and bony tenderness present. No crepitus. Normal pulse.  Feet:     Comments: Patient has tenderness to palpation with associated bruising present to circled area on diagram.  Also has tenderness palpation and bruising noted to left second digit of left foot.  Neurovascular intact.  Neurological:     General: No focal deficit present.     Mental Status: She is alert and oriented to person, place, and time. Mental status is at baseline.  Psychiatric:        Mood and Affect: Mood normal.        Behavior: Behavior normal.        Thought Content: Thought content normal.        Judgment: Judgment normal.     UC Treatments / Results  Labs (all labs ordered are listed, but only abnormal results are displayed) Labs Reviewed - No data to display  EKG   Radiology DG Foot Complete Left  Result Date: 10/21/2021 CLINICAL DATA:  Injury to left foot in a 20 year old female. EXAM: LEFT FOOT - COMPLETE 3+ VIEW COMPARISON:  None. FINDINGS: Lucency through the distal aspect of the second metatarsal just proximal to the head of the metatarsal. Signs of callus formation also raise the question of  subacute injury to this area. IMPRESSION: Findings that raise the question of subacute fracture of the second metatarsal shaft distally. No substantial displacement. Acute fracture in an area of prior injury is also considered. Electronically Signed   By: Donzetta Kohut M.D.   On: 10/21/2021 19:30    Procedures Procedures (including critical care time)  Medications Ordered in UC Medications - No data to display  Initial Impression / Assessment and Plan / UC Course  I have reviewed the triage vital signs and the nursing notes.  Pertinent labs & imaging results that were available during my care of the patient were reviewed by me and considered in my medical decision making (see chart for details).     Left foot x-ray does show nondisplaced fracture of the left second metatarsal.  Cam boot applied in urgent care as there is no splinting supplies available in urgent care today.  Crutches supplied for nonweightbearing.  discussed supportive care and RICE.  Patient provided contact information for orthopedist as she will need to contact them for further evaluation and management.  No red flags on exam and patient is neurovascularly intact.  Discussed strict return precautions.  Patient verbalized understanding and was agreeable with plan. Final Clinical Impressions(s) / UC Diagnoses   Final diagnoses:  Closed nondisplaced fracture of second metatarsal bone of left foot, initial encounter     Discharge Instructions      You have a fracture of your left foot.  A boot has been placed to immobilize.  Also use crutches for nonweightbearing until otherwise advised.  Please call provided contact information for orthopedist tomorrow for further evaluation and management.     ED Prescriptions   None    PDMP not reviewed this encounter.   Gustavus Bryant, [place] 10/21/21 [phone]

## 2021-10-21 NOTE — Discharge Instructions (Signed)
You have a fracture of your left foot.  A boot has been placed to immobilize.  Also use crutches for nonweightbearing until otherwise advised.  Please call provided contact information for orthopedist tomorrow for further evaluation and management.

## 2021-10-24 ENCOUNTER — Encounter (HOSPITAL_COMMUNITY): Payer: Self-pay | Admitting: Emergency Medicine

## 2021-10-25 ENCOUNTER — Other Ambulatory Visit: Payer: Self-pay

## 2021-10-25 ENCOUNTER — Ambulatory Visit (HOSPITAL_COMMUNITY)
Admission: EM | Admit: 2021-10-25 | Discharge: 2021-10-25 | Disposition: A | Discharge status: Home / Self Care | Payer: Medicaid Other | Attending: Urgent Care | Admitting: Urgent Care

## 2021-10-25 ENCOUNTER — Encounter (HOSPITAL_COMMUNITY): Payer: Self-pay

## 2021-10-25 DIAGNOSIS — K0889 Other specified disorders of teeth and supporting structures: Secondary | ICD-10-CM

## 2021-10-25 DIAGNOSIS — K047 Periapical abscess without sinus: Secondary | ICD-10-CM | POA: Diagnosis not present

## 2021-10-25 MED ORDER — NAPROXEN 500 MG PO TABS: 500.0000 mg | ORAL_TABLET | Freq: Two times a day (BID) | ORAL | 0 refills | Status: AC

## 2021-10-25 MED ORDER — AMOXICILLIN-POT CLAVULANATE 875-125 MG PO TABS: 1.0000 | ORAL_TABLET | Freq: Two times a day (BID) | ORAL | 0 refills | Status: DC

## 2021-10-25 MED ORDER — HYDROCODONE-ACETAMINOPHEN 5-325 MG PO TABS
1.0000 | ORAL_TABLET | Freq: Four times a day (QID) | ORAL | 0 refills | Status: AC | PRN
Start: 2021-10-25 — End: ?

## 2021-10-25 NOTE — ED Triage Notes (Signed)
Pt c/o wisdom tooth pain this morning. Pt states that her back molar is broken and that her gums are swelling. Pt was seen on 10/15/21 at [facility] for dental pain and was given Ibuprofen 800mg  for pain but states that it is not helping. Pt has an upcoming appoint with a dentist on 12/09/21 for the wisdom teeth removal.

## 2021-10-25 NOTE — ED Provider Notes (Signed)
[facility]   MRN: [id number] DOB: 2001-01-06  Subjective:   Jennifer Doyle is a 20 y.o. female presenting for acute onset recurrent dental pain, oral pain over the right lower side with facial swelling.  Patient has an appointment 2 weeks.  Has not been tolerating the pain well and has been overusing ibuprofen.  Patient's mother is concerned that she is taking way too much trying to control her pain.  No current facility-administered medications for this encounter.  Current Outpatient Medications:    ibuprofen (ADVIL) 800 MG tablet, Take 800 mg by mouth 3 (three) times daily., Disp: , Rfl:    ibuprofen (ADVIL) 600 MG tablet, Take 1 tablet (600 mg total) by mouth every 6 (six) hours as needed (mild pain)., Disp: 30 tablet, Rfl: 0   naproxen (NAPROSYN) 500 MG tablet, Take 1 tablet (500 mg total) by mouth 2 (two) times daily., Disp: 30 tablet, Rfl: 0   Allergies  Allergen Reactions   Peanut-Containing Drug Products Swelling    Throat swelling    Past Medical History:  Diagnosis Date   Chlamydia 05/21/2019   Gonorrhea 05/21/2019   MDD (major depressive disorder), single episode, severe , no psychosis (HCC) 07/27/2017   Suicidal ideation 07/27/2017     Past Surgical History:  Procedure Laterality Date   NO PAST SURGERIES      History reviewed. No pertinent family history.  Social History   Tobacco Use   Smoking status: Some Days    Types: Cigarettes   Smokeless tobacco: Never  Vaping Use   Vaping Use: Never used  Substance Use Topics   Alcohol use: Yes   Drug use: No    ROS   Objective:   Vitals: BP 128/76 (BP Location: Left Arm)   Pulse 96   Temp 98.5 F (36.9 C) (Oral)   Resp 18   Ht 5\' 3"  (1.6 m)   Wt 130 lb (59 kg)   LMP 09/25/2021   SpO2 98%   BMI 23.03 kg/m   Physical Exam Constitutional:      General: She is not in acute distress.    Appearance: Normal appearance. She is well-developed. She is not ill-appearing.  HENT:     Head:  Normocephalic and atraumatic.     Nose: Nose normal.     Mouth/Throat:     Mouth: Mucous membranes are moist.     Pharynx: Oropharynx is clear.   Eyes:     General: No scleral icterus.    Extraocular Movements: Extraocular movements intact.     Pupils: Pupils are equal, round, and reactive to light.  Cardiovascular:     Rate and Rhythm: Normal rate.  Pulmonary:     Effort: Pulmonary effort is normal.  Skin:    General: Skin is warm and dry.  Neurological:     General: No focal deficit present.     Mental Status: She is alert and oriented to person, place, and time.  Psychiatric:        Mood and Affect: Mood normal.        Behavior: Behavior normal.    Assessment and Plan :   PDMP not reviewed this encounter.  1. Pain, dental   2. Dental infection     Start Augmentin for dental infection/abscess, use naproxen for pain and inflammation.  Hydrocodone for breakthrough pain.  Emphasized need to keep appointment for dental surgeon consult. Counseled patient on potential for adverse effects with medications prescribed/recommended today, strict ER and  return-to-clinic precautions discussed, patient verbalized understanding.    Jaynee Eagles, [place] 10/25/21 1448

## 2021-10-29 ENCOUNTER — Encounter (HOSPITAL_COMMUNITY): Payer: Self-pay | Admitting: Emergency Medicine

## 2021-10-29 ENCOUNTER — Emergency Department (HOSPITAL_COMMUNITY)
Admission: EM | Admit: 2021-10-29 | Discharge: 2021-10-30 | Disposition: A | Discharge status: Home / Self Care | Payer: Medicaid Other | Attending: Emergency Medicine | Admitting: Emergency Medicine

## 2021-10-29 ENCOUNTER — Other Ambulatory Visit: Payer: Self-pay

## 2021-10-29 DIAGNOSIS — N939 Abnormal uterine and vaginal bleeding, unspecified: Secondary | ICD-10-CM | POA: Insufficient documentation

## 2021-10-29 DIAGNOSIS — R1084 Generalized abdominal pain: Secondary | ICD-10-CM | POA: Diagnosis present

## 2021-10-29 DIAGNOSIS — Z5321 Procedure and treatment not carried out due to patient leaving prior to being seen by health care provider: Secondary | ICD-10-CM | POA: Insufficient documentation

## 2021-10-29 LAB — COMPREHENSIVE METABOLIC PANEL
ALT: 10 U/L (ref 0–44)
AST: 18 U/L (ref 15–41)
Albumin: 3.8 g/dL (ref 3.5–5.0)
Alkaline Phosphatase: 47 U/L (ref 38–126)
Anion gap: 8 (ref 5–15)
BUN: 14 mg/dL (ref 6–20)
CO2: 23 mmol/L (ref 22–32)
Calcium: 8.9 mg/dL (ref 8.9–10.3)
Chloride: 108 mmol/L (ref 98–111)
Creatinine, Ser: 0.75 mg/dL (ref 0.44–1.00)
GFR, Estimated: >60 mL/min (ref >60)
Glucose, Bld: 88 mg/dL (ref 70–99)
Potassium: 3.5 mmol/L (ref 3.5–5.1)
Sodium: 139 mmol/L (ref 135–145)
Total Bilirubin: 0.5 mg/dL (ref 0.3–1.2)
Total Protein: 7.4 g/dL (ref 6.5–8.1)

## 2021-10-29 LAB — CBC
HCT: 31.7 % — ABNORMAL LOW (ref 36.0–46.0)
Hemoglobin: 10.4 g/dL — ABNORMAL LOW (ref 12.0–15.0)
MCH: 30.4 pg (ref 26.0–34.0)
MCHC: 32.8 g/dL (ref 30.0–36.0)
MCV: 92.7 fL (ref 80.0–100.0)
Platelets: 341 10*3/uL (ref 150–400)
RBC: 3.42 MIL/uL — ABNORMAL LOW (ref 3.87–5.11)
RDW: 16.8 % — ABNORMAL HIGH (ref 11.5–15.5)
WBC: 5.5 10*3/uL (ref 4.0–10.5)
nRBC: 0 % (ref 0.0–0.2)

## 2021-10-29 LAB — URINALYSIS, ROUTINE W REFLEX MICROSCOPIC
Bacteria, UA: NONE SEEN
Bilirubin Urine: NEGATIVE
Glucose, UA: NEGATIVE mg/dL
Ketones, ur: NEGATIVE mg/dL
Leukocytes,Ua: NEGATIVE
Nitrite: NEGATIVE
Protein, ur: NEGATIVE mg/dL
Specific Gravity, Urine: 1.021 (ref 1.005–1.030)
pH: 6 (ref 5.0–8.0)

## 2021-10-29 LAB — I-STAT BETA HCG BLOOD, ED (MC, WL, AP ONLY): I-stat hCG, quantitative: <5 m[IU]/mL (ref <5)

## 2021-10-29 LAB — LIPASE, BLOOD: Lipase: 59 U/L — ABNORMAL HIGH (ref 11–51)

## 2021-10-29 NOTE — ED Notes (Signed)
Called pt multiple times for vitals and for registration with no answer. Pt not seen in the lobby or outside
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
## 2021-11-07 ENCOUNTER — Encounter (HOSPITAL_COMMUNITY): Payer: Self-pay

## 2021-11-07 ENCOUNTER — Emergency Department (HOSPITAL_COMMUNITY)
Admission: EM | Admit: 2021-11-07 | Discharge: 2021-11-08 | Disposition: A | Discharge status: Home / Self Care | Payer: Medicaid Other | Attending: Emergency Medicine | Admitting: Emergency Medicine

## 2021-11-07 DIAGNOSIS — I10 Essential (primary) hypertension: Secondary | ICD-10-CM | POA: Insufficient documentation

## 2021-11-07 DIAGNOSIS — R42 Dizziness and giddiness: Secondary | ICD-10-CM

## 2021-11-07 DIAGNOSIS — N9489 Other specified conditions associated with female genital organs and menstrual cycle: Secondary | ICD-10-CM | POA: Diagnosis not present

## 2021-11-07 DIAGNOSIS — F17210 Nicotine dependence, cigarettes, uncomplicated: Secondary | ICD-10-CM | POA: Insufficient documentation

## 2021-11-07 DIAGNOSIS — Z91010 Allergy to peanuts: Secondary | ICD-10-CM | POA: Insufficient documentation

## 2021-11-07 LAB — I-STAT CHEM 8, ED
BUN: 11 mg/dL (ref 6–20)
Calcium, Ion: 1.18 mmol/L (ref 1.15–1.40)
Chloride: 105 mmol/L (ref 98–111)
Creatinine, Ser: 0.7 mg/dL (ref 0.44–1.00)
Glucose, Bld: 92 mg/dL (ref 70–99)
HCT: 33 % — ABNORMAL LOW (ref 36.0–46.0)
Hemoglobin: 11.2 g/dL — ABNORMAL LOW (ref 12.0–15.0)
Potassium: 3.8 mmol/L (ref 3.5–5.1)
Sodium: 141 mmol/L (ref 135–145)
TCO2: 25 mmol/L (ref 22–32)

## 2021-11-07 LAB — I-STAT BETA HCG BLOOD, ED (MC, WL, AP ONLY): I-stat hCG, quantitative: <5 m[IU]/mL (ref <5)

## 2021-11-07 NOTE — ED Triage Notes (Signed)
Patient reports from home with complaint of feeling light headed and dizzy. Pt reports last eating around 6 am and taking ibuprofen 800 mg for dental pain around 8 am.

## 2021-11-07 NOTE — ED Provider Notes (Signed)
Emergency Medicine Provider Triage Evaluation Note  Jennifer Doyle , a 20 y.o. female  was evaluated in triage.  Pt complains of lightheadedness.  Patient reports having dental pain with surgery scheduled for tomorrow to get several teeth extracted.  She states that today she began to feel lightheaded.  She also had a spinning sensation at times and "felt warm".  She has been taking ibuprofen for pain.  She states that she "spit up" some blood tonight.  No cough or shortness of breath.  Dizziness was worse when she stood up.  No history of arrhythmia or other heart problems.  She endorses decreased oral intake due to dental pain.  Review of Systems  Positive: Dental pain, lightheadedness Negative: Chest pain, shortness of breath  Physical Exam  BP (!) 144/101 (BP Location: Left Arm)    Pulse 98    Temp 98.5 F (36.9 C) (Oral)    Resp 15    Ht 5\' 3"  (1.6 m)    Wt 61.2 kg    LMP 10/24/2021 (Approximate)    SpO2 100%    BMI 23.91 kg/m  Gen:   Awake, no distress   Resp:  Normal effort  MSK:   Moves extremities without difficulty  Other:  Mouth no dental infection, tooth #32 is impacted and needs removed; cardiac regular rhythm, no murmur  Medical Decision Making  Medically screening exam initiated at 7:52 PM.  Appropriate orders placed.  Jennifer Doyle was informed that the remainder of the evaluation will be completed by another provider, this initial triage assessment does not replace that evaluation, and the importance of remaining in the ED until their evaluation is complete.     14/03/2021, PA-C 11/07/21 1954    11/09/21, MD 11/11/21 2103

## 2021-11-08 MED ORDER — FERROUS SULFATE 325 (65 FE) MG PO TABS: 325.0000 mg | ORAL_TABLET | Freq: Every day | ORAL | 0 refills | Status: AC

## 2021-11-08 NOTE — Discharge Instructions (Signed)
You are mildly anemic today but your kidneys are working well.  It may be that your blood pressure is elevated due to the pain you are having from your tooth but once you get your tooth pulled tomorrow in about a week's time you need to have your blood pressure rechecked and if it remains elevated you may have to have treatment in the future for elevated blood pressure.

## 2021-11-08 NOTE — ED Provider Notes (Signed)
[facility] DEPT Provider Note   CSN: [id number] Arrival date & time: 11/07/21  1920     History No chief complaint on file.   Jennifer Doyle is a 20 y.o. female.  Patient is a 20 year old female with a history of depression and more recently ongoing dental issues who is presenting today due to feeling lightheaded and having elevated blood pressure.  Patient reports that she has been dealing with a dental abscess and has completed antibiotics and is been taking ibuprofen due to ongoing pain but today she was feeling lightheaded.  She had not had much to eat today and reports she was just feeling like she might pass out.  She had no chest pain or shortness of breath.  She has not had cough or other symptoms other than just having dental pain.  No prior history of hypertension.  She does have a prior history of anemia but does not take any iron at this time.  The history is provided by the patient.      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 No pertinent family history.  Social  History   Tobacco Use   Smoking status: Some Days    Types: Cigarettes   Smokeless tobacco: Never  Vaping Use   Vaping Use: Never used  Substance Use Topics   Alcohol use: Yes   Drug use: No    Home Medications Prior to Admission medications   Medication Sig Start Date End Date Taking? Authorizing Provider  ferrous sulfate 325 (65 FE) MG tablet Take 1 tablet (325 mg total) by mouth daily. 11/08/21  Yes Blanchie Dessert, MD  amoxicillin-clavulanate (AUGMENTIN) 875-125 MG tablet Take 1 tablet by mouth every 12 (twelve) hours. 10/25/21   Jaynee Eagles, PA-C  HYDROcodone-acetaminophen (NORCO/VICODIN) 5-325 MG tablet Take 1 tablet by mouth every 6 (six) hours as needed for severe pain. 10/25/21   Jaynee Eagles, PA-C  ibuprofen (ADVIL) 600 MG tablet Take 1 tablet (600 mg total) by mouth every 6 (six) hours as needed (mild pain). 05/21/19   Constant, Peggy, MD  ibuprofen (ADVIL) 800 MG tablet Take 800 mg by mouth 3 (three) times daily. 10/15/21   [provider]  naproxen (NAPROSYN) 500 MG tablet Take 1 tablet (500 mg total) by mouth 2 (two) times daily. 10/25/21   Jaynee Eagles, PA-C    Allergies  Peanut-containing drug products  Review of Systems   Review of Systems  All other systems reviewed and are negative.  Physical Exam Updated Vital Signs BP (!) 157/117 Comment: MD aware, ok to discharge   Pulse 74    Temp 98.2 F (36.8 C) (Oral)    Resp 14    Ht 5\' 3"  (1.6 m)    Wt 61.2 kg    LMP 10/24/2021 (Approximate)    SpO2 100%    BMI 23.91 kg/m   Physical Exam Vitals and nursing note reviewed.  Constitutional:      General: She is not in acute distress.    Appearance: She is well-developed.  HENT:     Head: Normocephalic and atraumatic.     Mouth/Throat:     Mouth: Mucous membranes are moist.  Eyes:     Conjunctiva/sclera: Conjunctivae normal.     Pupils: Pupils are equal, round, and reactive to light.  Cardiovascular:     Rate and Rhythm: Normal rate and regular rhythm.      Heart sounds: Murmur heard.  Systolic murmur is present with a grade of 1/6.  Pulmonary:     Effort: Pulmonary effort is normal. No respiratory distress.     Breath sounds: Normal breath sounds. No wheezing or rales.  Abdominal:     General: There is no distension.     Palpations: Abdomen is soft.     Tenderness: There is no abdominal tenderness. There is no guarding or rebound.  Musculoskeletal:        General: No tenderness. Normal range of motion.     Cervical back: Normal range of motion and neck supple.     Right lower leg: No edema.     Left lower leg: No edema.  Skin:    General: Skin is warm and dry.     Findings: No erythema or rash.  Neurological:     Mental Status: She is alert and oriented to person, place, and time. Mental status is at baseline.  Psychiatric:        Mood and Affect: Mood normal.        Behavior: Behavior normal.    ED Results / Procedures / Treatments   Labs (all labs ordered are listed, but only abnormal results are displayed) Labs Reviewed  I-STAT CHEM 8, ED - Abnormal; Notable for the following components:      Result Value   Hemoglobin 11.2 (*)    HCT 33.0 (*)    All other components within normal limits  I-STAT BETA HCG BLOOD, ED ([facility], [facility], [facility] ONLY)    EKG EKG Interpretation  Date/Time:  Monday November 07 2021 20:13:58 EST Ventricular Rate:  89 PR Interval:  160 QRS Duration: 80 QT Interval:  362 QTC Calculation: 441 R Axis:   98 Text Interpretation: Sinus rhythm Borderline right axis deviation No previous tracing Confirmed by Blanchie Dessert [phone]) on 11/08/2021 3:25:11 AM  Radiology No results found.  Procedures Procedures   Medications Ordered in ED Medications - No data to display  ED Course  I have reviewed the triage vital signs and the nursing notes.  Pertinent labs & imaging results that were available during my care of the patient were reviewed by me and considered in my medical decision making (see chart  for details).    MDM Rules/Calculators/A&P                         Patient is a 20 year old  female presenting today with symptoms of feeling lightheaded.  She denies any headache, chest pain or shortness of breath.  EKG without acute findings and is normal today.  Patient had blood work done which showed normal renal function, hemoglobin of 11 and beta-hCG that was negative.  She reports she is no longer feeling lightheaded now.  She is noted to be hypertensive here and this is the first time she has ever been told this.  Feel that will be beneficial for patient to get her dental extraction tomorrow and then recheck blood pressure with her PCP and if it remains elevated she may require treatment.  She was also given a prescription for iron for her ongoing anemia.  She is otherwise well-appearing.  Feel that she is stable for discharge.  MDM   Amount and/or Complexity of Data Reviewed Clinical lab tests: ordered and reviewed Tests in the medicine section of CPT: ordered and reviewed Independent visualization of images, tracings, or specimens: yes       Final Clinical Impression(s) / ED Diagnoses Final diagnoses:  Lightheaded  Hypertension, unspecified type    Rx / DC Orders ED Discharge Orders          Ordered    ferrous sulfate 325 (65 FE) MG tablet  Daily        11/08/21 0333             Gwyneth Sprout, MD 11/08/21 [id number]

## 2021-12-05 ENCOUNTER — Emergency Department (HOSPITAL_COMMUNITY)
Admission: EM | Admit: 2021-12-05 | Discharge: 2021-12-06 | Disposition: A | Discharge status: Home / Self Care | Payer: Medicaid Other | Attending: Student | Admitting: Student

## 2021-12-05 DIAGNOSIS — R42 Dizziness and giddiness: Secondary | ICD-10-CM | POA: Diagnosis not present

## 2021-12-05 DIAGNOSIS — K0889 Other specified disorders of teeth and supporting structures: Secondary | ICD-10-CM | POA: Diagnosis present

## 2021-12-05 DIAGNOSIS — Z5321 Procedure and treatment not carried out due to patient leaving prior to being seen by health care provider: Secondary | ICD-10-CM | POA: Diagnosis not present

## 2021-12-05 LAB — BASIC METABOLIC PANEL
Anion gap: 7 (ref 5–15)
BUN: 9 mg/dL (ref 6–20)
CO2: 25 mmol/L (ref 22–32)
Calcium: 8.7 mg/dL — ABNORMAL LOW (ref 8.9–10.3)
Chloride: 106 mmol/L (ref 98–111)
Creatinine, Ser: 0.76 mg/dL (ref 0.44–1.00)
GFR, Estimated: >60 mL/min (ref >60)
Glucose, Bld: 74 mg/dL (ref 70–99)
Potassium: 4 mmol/L (ref 3.5–5.1)
Sodium: 138 mmol/L (ref 135–145)

## 2021-12-05 LAB — CBC
HCT: 32.3 % — ABNORMAL LOW (ref 36.0–46.0)
Hemoglobin: 10.4 g/dL — ABNORMAL LOW (ref 12.0–15.0)
MCH: 30 pg (ref 26.0–34.0)
MCHC: 32.2 g/dL (ref 30.0–36.0)
MCV: 93.1 fL (ref 80.0–100.0)
Platelets: 289 10*3/uL (ref 150–400)
RBC: 3.47 MIL/uL — ABNORMAL LOW (ref 3.87–5.11)
RDW: 16 % — ABNORMAL HIGH (ref 11.5–15.5)
WBC: 6.1 10*3/uL (ref 4.0–10.5)
nRBC: 0 % (ref 0.0–0.2)

## 2021-12-05 NOTE — ED Triage Notes (Signed)
Patient arrives POV c/o toothache that has been getting progressivly worse for a few days. Patient states she was hurting so bad she got lighthheaded. Patient states she was supposed to have 6 teeth pulled

## 2021-12-05 NOTE — ED Provider Triage Note (Signed)
Emergency Medicine Provider Triage Evaluation Note  Jennifer Doyle , a 21 y.o. female  was evaluated in triage.  Pt complains of dental pain for several weeks and near syncope tonight. States she has had right lower dental pain, progressively worsening, tonight with acute pain she felt lightheaded like she was going to pass out and had to sit down. No sycnope.   Review of Systems  Positive: Dental pain, lightheaded Negative: Syncope, fever, chest pain, dyspnea, vomiting.   Physical Exam  BP 125/83 (BP Location: Right Arm)    Pulse 91    Temp 98.3 F (36.8 C) (Oral)    Resp 16    SpO2 100%  Gen:   Awake, no distress   Resp:  Normal effort  MSK:   Moves extremities without difficulty  Other:  Posterior oropharynx is symmetric appearing. Patient tolerating own secretions without difficulty. No trismus. No drooling. No hot potato voice. No swelling beneath the tongue, submandibular compartment is soft.  Medical Decision Making  Medically screening exam initiated at 10:40 PM.  Appropriate orders placed.  Jennifer Doyle was informed that the remainder of the evaluation will be completed by another provider, this initial triage assessment does not replace that evaluation, and the importance of remaining in the ED until their evaluation is complete.  Dental pain Near syncope episode.    Jennifer Anderson, PA-C 12/05/21 2249

## 2021-12-06 LAB — I-STAT BETA HCG BLOOD, ED (MC, WL, AP ONLY): I-stat hCG, quantitative: <5 m[IU]/mL (ref <5)

## 2021-12-06 NOTE — ED Notes (Signed)
Patient called x2 for reass in triage with no answer

## 2022-01-06 ENCOUNTER — Other Ambulatory Visit: Payer: Self-pay

## 2022-01-06 ENCOUNTER — Encounter (HOSPITAL_BASED_OUTPATIENT_CLINIC_OR_DEPARTMENT_OTHER): Payer: Self-pay | Admitting: Oral Surgery

## 2022-01-10 NOTE — H&P (Signed)
°  Patient: Jennifer Doyle  PID: [id number]  DOB: 01-15-2001  SEX: Female   Patient referred by DDS for extraction teeth # 1, 14, 16, 17, 31, 32.   CC: Pain upper left ( points to #14)  Past Medical History:  Smoker    Medications: None    Allergies:     NKDA    Surgeries:   None     Social History       Smoking:            Alcohol: Drug use: Marijuana                            Exam: BMI 25. Marijuana odor. Large decay #14,  percussion tender. Roots #31 present.    No purulence, edema, fluctuance, trismus. Oral cancer screening negative. Pharynx clear. No lymphadenopathy.  Panorex: Impacted 1, 51, 16, 66, 17, 32, Gross decay 14, 31.   Assessment: ASA 2. Impacted 1, 51, 16, 66, 17, 32,  Non-restorable teeth# 14, 31.              Plan: 1. Extraction Teeth #1, 51, 14, 16, 66, 17, 31, [facility] Day surgery.                 Rx: n               Risks and complications explained. Questions answered.   Gae Bon, DMD

## 2022-01-12 ENCOUNTER — Encounter (HOSPITAL_BASED_OUTPATIENT_CLINIC_OR_DEPARTMENT_OTHER): Payer: Self-pay | Admitting: Anesthesiology

## 2022-01-12 NOTE — Anesthesia Preprocedure Evaluation (Deleted)
Anesthesia Evaluation    Reviewed: Allergy & Precautions, Patient's Chart, lab work & pertinent test results  Airway        Dental   Pulmonary Current Smoker,           Cardiovascular negative cardio ROS       Neuro/Psych PSYCHIATRIC DISORDERS Depression negative neurological ROS     GI/Hepatic negative GI ROS, Neg liver ROS,   Endo/Other  negative endocrine ROS  Renal/GU negative Renal ROS  negative genitourinary   Musculoskeletal negative musculoskeletal ROS (+)   Abdominal   Peds  Hematology negative hematology ROS (+)   Anesthesia Other Findings   Reproductive/Obstetrics negative OB ROS                             Anesthesia Physical Anesthesia Plan  ASA: 2  Anesthesia Plan: General   Post-op Pain Management: Tylenol PO (pre-op)*   Induction: Intravenous  PONV Risk Score and Plan: 2 and Ondansetron, Dexamethasone, Midazolam and Treatment may vary due to age or medical condition  Airway Management Planned: Nasal ETT  Additional Equipment: None  Intra-op Plan:   Post-operative Plan: Extubation in OR  Informed Consent:   Plan Discussed with:   Anesthesia Plan Comments:         Anesthesia Quick Evaluation

## 2022-01-13 ENCOUNTER — Ambulatory Visit (HOSPITAL_BASED_OUTPATIENT_CLINIC_OR_DEPARTMENT_OTHER): Admission: RE | Admit: 2022-01-13 | Payer: Medicaid Other | Source: Home / Self Care | Admitting: Oral Surgery

## 2022-01-13 DIAGNOSIS — Z01818 Encounter for other preprocedural examination: Secondary | ICD-10-CM

## 2022-01-13 SURGERY — DENTAL RESTORATION/EXTRACTIONS
Anesthesia: General

## 2022-02-23 ENCOUNTER — Other Ambulatory Visit: Payer: Self-pay

## 2022-02-23 ENCOUNTER — Emergency Department (HOSPITAL_BASED_OUTPATIENT_CLINIC_OR_DEPARTMENT_OTHER)
Admission: EM | Admit: 2022-02-23 | Discharge: 2022-02-24 | Disposition: A | Discharge status: Home / Self Care | Payer: Medicaid Other | Attending: Emergency Medicine | Admitting: Emergency Medicine

## 2022-02-23 ENCOUNTER — Encounter (HOSPITAL_BASED_OUTPATIENT_CLINIC_OR_DEPARTMENT_OTHER): Payer: Self-pay | Admitting: Urology

## 2022-02-23 DIAGNOSIS — Z91010 Allergy to peanuts: Secondary | ICD-10-CM | POA: Diagnosis not present

## 2022-02-23 DIAGNOSIS — B349 Viral infection, unspecified: Secondary | ICD-10-CM | POA: Insufficient documentation

## 2022-02-23 DIAGNOSIS — Z20822 Contact with and (suspected) exposure to covid-19: Secondary | ICD-10-CM | POA: Insufficient documentation

## 2022-02-23 DIAGNOSIS — R0981 Nasal congestion: Secondary | ICD-10-CM | POA: Diagnosis present

## 2022-02-23 NOTE — ED Triage Notes (Signed)
Body aches, chills, sinus congestion, sore throat that started 3 days ago  ? ?

## 2022-02-23 NOTE — ED Provider Notes (Signed)
?[facility] EMERGENCY DEPARTMENT ?Provider Note ? ? ?CSN: [id number] ?Arrival date & time: 02/23/22  2302 ? ?  ? ?History ? ?Chief Complaint  ?Patient presents with  ? flu like symptoms  ? ? ?Jennifer Doyle is a 21 y.o. female. ? ?The history is provided by the patient.  ?Jennifer Doyle is a 21 y.o. female who presents to the Emergency Department complaining of flu like sxs.  She presents to the ED for evaluation of flu like symptoms that started three days ago.  She has nasal congestion, sore throat, sneezing, nausea, body aches. ? ?No fever, cough, vomiting, abdominal pain, dysuria.   ? ?Denies chance of pregnancy.   ? ?No medical problems, takes no medications.  No known sick contacts.   ?  ? ?Home Medications ?Prior to Admission medications   ?Medication Sig Start Date End Date Taking? Authorizing Provider  ?ferrous sulfate 325 (65 FE) MG tablet Take 1 tablet (325 mg total) by mouth daily. 11/08/21   Blanchie Dessert, MD  ?HYDROcodone-acetaminophen (NORCO/VICODIN) 5-325 MG tablet Take 1 tablet by mouth every 6 (six) hours as needed for severe pain. 10/25/21   Jaynee Eagles, PA-C  ?ibuprofen (ADVIL) 800 MG tablet Take 800 mg by mouth 3 (three) times daily. 10/15/21   [provider]  ?naproxen (NAPROSYN) 500 MG tablet Take 1 tablet (500 mg total) by mouth 2 (two) times daily. 10/25/21   Jaynee Eagles, PA-C  ?   ? ?Allergies    ?Peanut-containing drug products   ? ?Review of Systems   ?Review of Systems  ?All other systems reviewed and are negative. ? ?Physical Exam ?Updated Vital Signs ?BP 108/77 (BP Location: Left Arm)   Pulse (!) 104   Temp 98.1 ?F (36.7 ?C) (Oral)   Resp 18   Ht 5\' 3"  (1.6 m)   Wt 61.2 kg   LMP 02/14/2022 (Approximate)   SpO2 99%   BMI 23.91 kg/m?  ?Physical Exam ?Vitals and nursing note reviewed.  ?Constitutional:   ?   Appearance: She is well-developed.  ?HENT:  ?   Head: Normocephalic and atraumatic.  ?   Comments: Rhinorhea, boggy right nasal mucosa with clear drainage.   No maxillary or frontal sinus tenderness to palpation.  No significant erythema, edema or exudates in the posterior op.  ?Cardiovascular:  ?   Rate and Rhythm: Normal rate and regular rhythm.  ?   Heart sounds: No murmur heard. ?Pulmonary:  ?   Effort: Pulmonary effort is normal. No respiratory distress.  ?   Breath sounds: Normal breath sounds.  ?Abdominal:  ?   Palpations: Abdomen is soft.  ?   Tenderness: There is no abdominal tenderness. There is no guarding or rebound.  ?Musculoskeletal:     ?   General: No swelling or tenderness.  ?   Cervical back: Neck supple.  ?Skin: ?   General: Skin is warm and dry.  ?Neurological:  ?   Mental Status: She is alert and oriented to person, place, and time.  ?Psychiatric:     ?   Behavior: Behavior normal.  ? ? ?ED Results / Procedures / Treatments   ?Labs ?(all labs ordered are listed, but only abnormal results are displayed) ?Labs Reviewed  ?RESP PANEL BY RT-PCR (FLU A&B, COVID) ARPGX2  ? ? ?EKG ?None ? ?Radiology ?No results found. ? ?Procedures ?Procedures  ? ? ?Medications Ordered in ED ?Medications  ?ibuprofen (ADVIL) tablet 600 mg (has no administration in time range)  ?oxymetazoline (AFRIN) 0.05 %  nasal spray 1 spray (has no administration in time range)  ? ? ?ED Course/ Medical Decision Making/ A&P ?  ?                        ?Medical Decision Making ?Risk ?OTC drugs. ? ? ?Patient here for evaluation of nasal congestion, sore throat, body aches for the last 3 days.  She is nontoxic-appearing on evaluation with no respiratory distress.  She does have nasal congestion.  No evidence of serious bacterial infection such as bacterial sinusitis, strep pharyngitis, PTA, RPA, pneumonia.  Counseled patient on home care for viral illness with supportive measures such as Afrin nasal spray, OTC analgesics.  Discussed return precautions.   ? ? ? ? ? ? ?Final Clinical Impression(s) / ED Diagnoses ?Final diagnoses:  ?Viral illness  ? ? ?Rx / DC Orders ?ED Discharge Orders   ? ?  None  ? ?  ? ? ?  ?Quintella Reichert, MD ?02/24/22 0009 ? ?

## 2022-02-24 LAB — RESP PANEL BY RT-PCR (FLU A&B, COVID) ARPGX2
Influenza A by PCR: NEGATIVE
Influenza B by PCR: NEGATIVE
SARS Coronavirus 2 by RT PCR: NEGATIVE

## 2022-02-24 MED ORDER — OXYMETAZOLINE HCL 0.05 % NA SOLN
1.0000 | Freq: Once | NASAL | Status: AC
Start: 2022-02-24 — End: 2022-02-24
  Administered 2022-02-24: 1 via NASAL
  Filled 2022-02-24: qty 30

## 2022-02-24 MED ORDER — IBUPROFEN 400 MG PO TABS
600.0000 mg | ORAL_TABLET | Freq: Once | ORAL | Status: AC
  Administered 2022-02-24: 600 mg via ORAL
  Filled 2022-02-24: qty 1

## 2022-02-24 NOTE — Discharge Instructions (Signed)
You can use the nasal spray, one spray in each nostril twice daily for three days.   ? ?Drink plenty of fluids.  ? ?You can take tylenol and ibuprofen, available over the counter according to label instructions as needed for body aches.   ?

## 2022-05-22 ENCOUNTER — Emergency Department (HOSPITAL_BASED_OUTPATIENT_CLINIC_OR_DEPARTMENT_OTHER)
Admission: EM | Admit: 2022-05-22 | Discharge: 2022-05-22 | Discharge status: Against Medical Advice | Payer: Medicaid Other | Source: Home / Self Care

## 2022-05-22 ENCOUNTER — Other Ambulatory Visit: Payer: Self-pay

## 2022-09-20 ENCOUNTER — Encounter (HOSPITAL_BASED_OUTPATIENT_CLINIC_OR_DEPARTMENT_OTHER): Payer: Self-pay | Admitting: Emergency Medicine

## 2022-09-20 ENCOUNTER — Emergency Department (HOSPITAL_BASED_OUTPATIENT_CLINIC_OR_DEPARTMENT_OTHER)
Admission: EM | Admit: 2022-09-20 | Discharge: 2022-09-21 | Disposition: A | Discharge status: Home / Self Care | Payer: Medicaid Other | Attending: Emergency Medicine | Admitting: Emergency Medicine

## 2022-09-20 DIAGNOSIS — A599 Trichomoniasis, unspecified: Secondary | ICD-10-CM | POA: Insufficient documentation

## 2022-09-20 DIAGNOSIS — A6000 Herpesviral infection of urogenital system, unspecified: Secondary | ICD-10-CM | POA: Diagnosis not present

## 2022-09-20 DIAGNOSIS — N898 Other specified noninflammatory disorders of vagina: Secondary | ICD-10-CM | POA: Diagnosis present

## 2022-09-20 DIAGNOSIS — A6009 Herpesviral infection of other urogenital tract: Secondary | ICD-10-CM

## 2022-09-20 DIAGNOSIS — Z91010 Allergy to peanuts: Secondary | ICD-10-CM | POA: Diagnosis not present

## 2022-09-20 NOTE — ED Triage Notes (Signed)
Pt states she has some vaginal swelling and irritation  Pt states when she urinates it is painful to her private area   Denies vaginal discharge  Sxs started 4 days ago

## 2022-09-21 LAB — URINALYSIS, ROUTINE W REFLEX MICROSCOPIC
Bilirubin Urine: NEGATIVE
Glucose, UA: NEGATIVE mg/dL
Hgb urine dipstick: NEGATIVE
Ketones, ur: 15 mg/dL — AB
Nitrite: NEGATIVE
Protein, ur: 30 mg/dL — AB
Specific Gravity, Urine: >=1.03 (ref 1.005–1.030)
pH: 6 (ref 5.0–8.0)

## 2022-09-21 LAB — WET PREP, GENITAL
Clue Cells Wet Prep HPF POC: NONE SEEN
Sperm: NONE SEEN
WBC, Wet Prep HPF POC: >=10 — AB (ref <10)
Yeast Wet Prep HPF POC: NONE SEEN

## 2022-09-21 LAB — HIV ANTIBODY (ROUTINE TESTING W REFLEX): HIV Screen 4th Generation wRfx: NONREACTIVE

## 2022-09-21 LAB — URINALYSIS, MICROSCOPIC (REFLEX)

## 2022-09-21 LAB — PREGNANCY, URINE: Preg Test, Ur: NEGATIVE

## 2022-09-21 MED ORDER — DOXYCYCLINE HYCLATE 100 MG PO CAPS: 100.0000 mg | ORAL_CAPSULE | Freq: Two times a day (BID) | ORAL | 0 refills | Status: AC

## 2022-09-21 MED ORDER — CEFTRIAXONE SODIUM 500 MG IJ SOLR
500.0000 mg | Freq: Once | INTRAMUSCULAR | Status: AC
  Administered 2022-09-21: 500 mg via INTRAMUSCULAR
  Filled 2022-09-21: qty 500

## 2022-09-21 MED ORDER — DOXYCYCLINE HYCLATE 100 MG PO TABS
100.0000 mg | ORAL_TABLET | Freq: Once | ORAL | Status: AC
  Administered 2022-09-21: 100 mg via ORAL
  Filled 2022-09-21: qty 1

## 2022-09-21 MED ORDER — VALACYCLOVIR HCL 1 G PO TABS: 1000.0000 mg | ORAL_TABLET | Freq: Three times a day (TID) | ORAL | 0 refills | Status: AC

## 2022-09-21 MED ORDER — METRONIDAZOLE 500 MG PO TABS: 500.0000 mg | ORAL_TABLET | Freq: Two times a day (BID) | ORAL | 0 refills | Status: AC

## 2022-09-21 NOTE — ED Provider Notes (Signed)
[facility] EMERGENCY DEPARTMENT Provider Note   CSN: [id number] Arrival date & time: 09/20/22  2331     History  Chief Complaint  Patient presents with   vaginal irritation    Jennifer Doyle is a 21 y.o. female.  21 yo F with a chief complaints of vaginal irritation and burning.  She tells me that there is a sore down there.  Has noticed it for about 4 days.  Denies any discharge is unsure if she might be pregnant.        Home Medications Prior to Admission medications   Medication Sig Start Date End Date Taking? Authorizing Provider  doxycycline (VIBRAMYCIN) 100 MG capsule Take 1 capsule (100 mg total) by mouth 2 (two) times daily. One po bid x 7 days 09/21/22  Yes Deno Etienne, DO  metroNIDAZOLE (FLAGYL) 500 MG tablet Take 1 tablet (500 mg total) by mouth 2 (two) times daily. 09/21/22  Yes Deno Etienne, DO  valACYclovir (VALTREX) 1000 MG tablet Take 1 tablet (1,000 mg total) by mouth 3 (three) times daily. 09/21/22  Yes Deno Etienne, DO  ferrous sulfate 325 (65 FE) MG tablet Take 1 tablet (325 mg total) by mouth daily. 11/08/21   Blanchie Dessert, MD  HYDROcodone-acetaminophen (NORCO/VICODIN) 5-325 MG tablet Take 1 tablet by mouth every 6 (six) hours as needed for severe pain. 10/25/21   Jaynee Eagles, PA-C  ibuprofen (ADVIL) 800 MG tablet Take 800 mg by mouth 3 (three) times daily. 10/15/21   [provider]  naproxen (NAPROSYN) 500 MG tablet Take 1 tablet (500 mg total) by mouth 2 (two) times daily. 10/25/21   Jaynee Eagles, PA-C      Allergies    Peanut-containing drug products    Review of Systems   Review of Systems  Physical Exam Updated Vital Signs BP (!) 133/99 (BP Location: Right Arm)   Pulse (!) 105   Temp 98.1 F (36.7 C) (Oral)   Resp 18   Ht 5\' 3"  (1.6 m)   Wt 70.3 kg   LMP 08/20/2022 (Exact Date)   SpO2 100%   BMI 27.46 kg/m  Physical Exam Vitals and nursing note reviewed.  Constitutional:      General: She is not in acute distress.     Appearance: She is well-developed. She is not diaphoretic.  HENT:     Head: Normocephalic and atraumatic.  Eyes:     Pupils: Pupils are equal, round, and reactive to light.  Cardiovascular:     Rate and Rhythm: Normal rate and regular rhythm.     Heart sounds: No murmur heard.    No friction rub. No gallop.  Pulmonary:     Effort: Pulmonary effort is normal.     Breath sounds: No wheezing or rales.  Abdominal:     General: There is no distension.     Palpations: Abdomen is soft.     Tenderness: There is no abdominal tenderness.  Genitourinary:    Comments: Lesions about the vagina.  Some ulcerations.  Whitish discharge.  No obvious adnexal tenderness. Musculoskeletal:        General: No tenderness.     Cervical back: Normal range of motion and neck supple.  Skin:    General: Skin is warm and dry.  Neurological:     Mental Status: She is alert and oriented to person, place, and time.  Psychiatric:        Behavior: Behavior normal.     ED Results / Procedures / Treatments  Labs (all labs ordered are listed, but only abnormal results are displayed) Labs Reviewed  WET PREP, GENITAL - Abnormal; Notable for the following components:      Result Value   Trich, Wet Prep PRESENT (*)    WBC, Wet Prep HPF POC >=10 (*)    All other components within normal limits  URINALYSIS, ROUTINE W REFLEX MICROSCOPIC - Abnormal; Notable for the following components:   APPearance CLOUDY (*)    Ketones, ur 15 (*)    Protein, ur 30 (*)    Leukocytes,Ua SMALL (*)    All other components within normal limits  URINALYSIS, MICROSCOPIC (REFLEX) - Abnormal; Notable for the following components:   Bacteria, UA FEW (*)    All other components within normal limits  PREGNANCY, URINE  RPR  HIV ANTIBODY (ROUTINE TESTING W REFLEX)  GC/CHLAMYDIA PROBE AMP ([place]) NOT AT [facility]    EKG None  Radiology No results found.  Procedures Procedures    Medications Ordered in ED Medications   cefTRIAXone (ROCEPHIN) injection 500 mg (500 mg Intramuscular Given 09/21/22 0041)  doxycycline (VIBRA-TABS) tablet 100 mg (100 mg Oral Given 09/21/22 0041)    ED Course/ Medical Decision Making/ A&P                           Medical Decision Making Amount and/or Complexity of Data Reviewed Labs: ordered.  Risk Prescription drug management.   21 yo F with a chief complaints of vaginal irritation.  Going on for about 4 days now.  She denies any obvious trauma to the area.  We will perform pelvic exam.  Pelvic exam concerning for herpes.  Patient also positive for trichomoniasis.  We will treat for GC.  Valacyclovir.  1:03 AM:  I have discussed the diagnosis/risks/treatment options with the patient and family.  Evaluation and diagnostic testing in the emergency department does not suggest an emergent condition requiring admission or immediate intervention beyond what has been performed at this time.  They will follow up with Gyn. We also discussed returning to the ED immediately if new or worsening sx occur. We discussed the sx which are most concerning (e.g., sudden worsening pain, fever, inability to tolerate by mouth) that necessitate immediate return. Medications administered to the patient during their visit and any new prescriptions provided to the patient are listed below.  Medications given during this visit Medications  cefTRIAXone (ROCEPHIN) injection 500 mg (500 mg Intramuscular Given 09/21/22 0041)  doxycycline (VIBRA-TABS) tablet 100 mg (100 mg Oral Given 09/21/22 0041)     The patient appears reasonably screen and/or stabilized for discharge and I doubt any other medical condition or other [facility] requiring further screening, evaluation, or treatment in the ED at this time prior to discharge.          Final Clinical Impression(s) / ED Diagnoses Final diagnoses:  Herpes genitalis in women  Trichomoniasis    Rx / DC Orders ED Discharge Orders          Ordered     metroNIDAZOLE (FLAGYL) 500 MG tablet  2 times daily        09/21/22 0101    doxycycline (VIBRAMYCIN) 100 MG capsule  2 times daily        09/21/22 0101    valACYclovir (VALTREX) 1000 MG tablet  3 times daily        09/21/22 0102              Tyrone Nine,  Linna Hoff, DO 09/21/22 [id number]

## 2022-09-21 NOTE — Discharge Instructions (Addendum)
Based on your exam I am concerned that you have herpes.  We treat this with an antiviral medication.  Your immediate testing here also shows that you have trichomoniasis.  This is also a sexually transmitted disease.  I have started you on multiple medications that can help treat you for this.  Please contact your sexual contacts for the last 90 days and have them tested and treated as well.  Please abstain from sexual contact until at least 1 week after you have finished your antibiotic treatment.  I have given you follow-up for the health department or the women's clinic.

## 2022-09-22 LAB — GC/CHLAMYDIA PROBE AMP (~~LOC~~) NOT AT ARMC
Chlamydia: POSITIVE — AB
Comment: NEGATIVE
Comment: NORMAL
Neisseria Gonorrhea: NEGATIVE

## 2022-09-22 LAB — RPR
RPR Ser Ql: REACTIVE — AB
RPR Titer: 1:32 {titer}

## 2022-09-25 LAB — T.PALLIDUM AB, TOTAL: T Pallidum Abs: REACTIVE — AB
